# Patient Record
Sex: Female | Born: 1975
Health system: Southern US, Community
[De-identification: ages and names within clinical notes are randomized; demographics above are authoritative.]

## PROBLEM LIST (undated history)

## (undated) DIAGNOSIS — IMO0002 Reserved for concepts with insufficient information to code with codable children: Secondary | ICD-10-CM

## (undated) DIAGNOSIS — Q600 Renal agenesis, unilateral: Secondary | ICD-10-CM

## (undated) DIAGNOSIS — N739 Female pelvic inflammatory disease, unspecified: Secondary | ICD-10-CM

## (undated) DIAGNOSIS — N870 Mild cervical dysplasia: Secondary | ICD-10-CM

## (undated) DIAGNOSIS — J45909 Unspecified asthma, uncomplicated: Secondary | ICD-10-CM

## (undated) DIAGNOSIS — F329 Major depressive disorder, single episode, unspecified: Secondary | ICD-10-CM

## (undated) DIAGNOSIS — F32A Depression, unspecified: Secondary | ICD-10-CM

## (undated) DIAGNOSIS — N200 Calculus of kidney: Secondary | ICD-10-CM

## (undated) HISTORY — PX: NEPHRECTOMY: SHX65

## (undated) HISTORY — DX: Calculus of kidney: N20.0

## (undated) HISTORY — DX: Depression, unspecified: F32.A

## (undated) HISTORY — DX: Renal agenesis, unilateral: Q60.0

## (undated) HISTORY — DX: Mild cervical dysplasia: N87.0

## (undated) HISTORY — DX: Female pelvic inflammatory disease, unspecified: N73.9

## (undated) HISTORY — DX: Reserved for concepts with insufficient information to code with codable children: IMO0002

## (undated) HISTORY — PX: DILATION AND CURETTAGE OF UTERUS: SHX78

## (undated) HISTORY — DX: Major depressive disorder, single episode, unspecified: F32.9

---

## 1999-05-06 ENCOUNTER — Encounter: Payer: Self-pay | Admitting: *Deleted

## 1999-05-06 ENCOUNTER — Observation Stay (HOSPITAL_COMMUNITY): Admission: AD | Admit: 1999-05-06 | Discharge: 1999-05-06 | Payer: Self-pay | Admitting: *Deleted

## 1999-05-21 ENCOUNTER — Ambulatory Visit (HOSPITAL_COMMUNITY): Admission: RE | Admit: 1999-05-21 | Discharge: 1999-05-21 | Payer: Self-pay | Admitting: *Deleted

## 1999-06-04 ENCOUNTER — Encounter: Payer: Self-pay | Admitting: *Deleted

## 1999-06-04 ENCOUNTER — Ambulatory Visit (HOSPITAL_COMMUNITY): Admission: RE | Admit: 1999-06-04 | Discharge: 1999-06-04 | Payer: Self-pay | Admitting: *Deleted

## 1999-07-03 ENCOUNTER — Encounter: Payer: Self-pay | Admitting: Obstetrics & Gynecology

## 1999-07-03 ENCOUNTER — Ambulatory Visit (HOSPITAL_COMMUNITY): Admission: RE | Admit: 1999-07-03 | Discharge: 1999-07-03 | Payer: Self-pay | Admitting: Obstetrics & Gynecology

## 1999-09-02 ENCOUNTER — Inpatient Hospital Stay (HOSPITAL_COMMUNITY): Admission: RE | Admit: 1999-09-02 | Discharge: 1999-09-02 | Payer: Self-pay | Admitting: Obstetrics and Gynecology

## 1999-09-02 ENCOUNTER — Encounter: Payer: Self-pay | Admitting: Obstetrics and Gynecology

## 1999-10-11 ENCOUNTER — Inpatient Hospital Stay (HOSPITAL_COMMUNITY): Admission: AD | Admit: 1999-10-11 | Discharge: 1999-10-11 | Payer: Self-pay | Admitting: *Deleted

## 1999-10-16 ENCOUNTER — Inpatient Hospital Stay (HOSPITAL_COMMUNITY): Admission: AD | Admit: 1999-10-16 | Discharge: 1999-10-19 | Payer: Self-pay | Admitting: Obstetrics and Gynecology

## 1999-10-16 ENCOUNTER — Inpatient Hospital Stay (HOSPITAL_COMMUNITY): Admission: AD | Admit: 1999-10-16 | Discharge: 1999-10-16 | Payer: Self-pay | Admitting: Obstetrics and Gynecology

## 1999-10-16 ENCOUNTER — Observation Stay (HOSPITAL_COMMUNITY): Admission: AD | Admit: 1999-10-16 | Discharge: 1999-10-16 | Payer: Self-pay | Admitting: Obstetrics and Gynecology

## 2000-12-05 ENCOUNTER — Inpatient Hospital Stay (HOSPITAL_COMMUNITY): Admission: AD | Admit: 2000-12-05 | Discharge: 2000-12-05 | Payer: Self-pay | Admitting: Obstetrics and Gynecology

## 2001-02-03 ENCOUNTER — Encounter: Admission: RE | Admit: 2001-02-03 | Discharge: 2001-02-03 | Payer: Self-pay | Admitting: Family Medicine

## 2001-03-05 ENCOUNTER — Encounter: Admission: RE | Admit: 2001-03-05 | Discharge: 2001-03-05 | Payer: Self-pay | Admitting: Family Medicine

## 2001-04-15 ENCOUNTER — Encounter: Admission: RE | Admit: 2001-04-15 | Discharge: 2001-04-15 | Payer: Self-pay | Admitting: Family Medicine

## 2001-08-03 ENCOUNTER — Emergency Department (HOSPITAL_COMMUNITY): Admission: EM | Admit: 2001-08-03 | Discharge: 2001-08-03 | Payer: Self-pay | Admitting: Emergency Medicine

## 2002-10-21 ENCOUNTER — Emergency Department (HOSPITAL_COMMUNITY): Admission: EM | Admit: 2002-10-21 | Discharge: 2002-10-21 | Payer: Self-pay | Admitting: Emergency Medicine

## 2002-12-26 ENCOUNTER — Encounter: Admission: RE | Admit: 2002-12-26 | Discharge: 2002-12-26 | Payer: Self-pay | Admitting: Family Medicine

## 2003-02-09 ENCOUNTER — Other Ambulatory Visit: Admission: RE | Admit: 2003-02-09 | Discharge: 2003-02-09 | Payer: Self-pay | Admitting: Family Medicine

## 2003-02-09 ENCOUNTER — Encounter: Admission: RE | Admit: 2003-02-09 | Discharge: 2003-02-09 | Payer: Self-pay | Admitting: Family Medicine

## 2003-02-09 ENCOUNTER — Encounter (INDEPENDENT_AMBULATORY_CARE_PROVIDER_SITE_OTHER): Payer: Self-pay | Admitting: *Deleted

## 2003-06-14 ENCOUNTER — Encounter: Admission: RE | Admit: 2003-06-14 | Discharge: 2003-06-14 | Payer: Self-pay | Admitting: Family Medicine

## 2003-06-29 ENCOUNTER — Encounter: Admission: RE | Admit: 2003-06-29 | Discharge: 2003-06-29 | Payer: Self-pay | Admitting: Family Medicine

## 2003-12-02 ENCOUNTER — Encounter (INDEPENDENT_AMBULATORY_CARE_PROVIDER_SITE_OTHER): Payer: Self-pay | Admitting: *Deleted

## 2003-12-02 LAB — CONVERTED CEMR LAB

## 2003-12-07 ENCOUNTER — Ambulatory Visit: Payer: Self-pay | Admitting: Family Medicine

## 2003-12-07 ENCOUNTER — Encounter (INDEPENDENT_AMBULATORY_CARE_PROVIDER_SITE_OTHER): Payer: Self-pay | Admitting: *Deleted

## 2003-12-07 ENCOUNTER — Other Ambulatory Visit: Admission: RE | Admit: 2003-12-07 | Discharge: 2003-12-07 | Payer: Self-pay | Admitting: Family Medicine

## 2004-07-03 ENCOUNTER — Ambulatory Visit: Payer: Self-pay | Admitting: Family Medicine

## 2004-07-05 ENCOUNTER — Ambulatory Visit (HOSPITAL_COMMUNITY): Admission: RE | Admit: 2004-07-05 | Discharge: 2004-07-05 | Payer: Self-pay | Admitting: Internal Medicine

## 2005-01-27 HISTORY — PX: CERVIX LESION DESTRUCTION: SHX591

## 2005-03-30 ENCOUNTER — Emergency Department (HOSPITAL_COMMUNITY): Admission: EM | Admit: 2005-03-30 | Discharge: 2005-03-30 | Payer: Self-pay | Admitting: Emergency Medicine

## 2005-06-23 ENCOUNTER — Emergency Department (HOSPITAL_COMMUNITY): Admission: EM | Admit: 2005-06-23 | Discharge: 2005-06-23 | Payer: Self-pay | Admitting: *Deleted

## 2005-07-23 ENCOUNTER — Other Ambulatory Visit: Admission: RE | Admit: 2005-07-23 | Discharge: 2005-07-23 | Payer: Self-pay | Admitting: Family Medicine

## 2005-07-23 ENCOUNTER — Encounter (INDEPENDENT_AMBULATORY_CARE_PROVIDER_SITE_OTHER): Payer: Self-pay | Admitting: *Deleted

## 2005-07-23 ENCOUNTER — Ambulatory Visit: Payer: Self-pay | Admitting: Family Medicine

## 2005-09-25 ENCOUNTER — Ambulatory Visit: Payer: Self-pay | Admitting: Family Medicine

## 2005-11-18 ENCOUNTER — Encounter (INDEPENDENT_AMBULATORY_CARE_PROVIDER_SITE_OTHER): Payer: Self-pay | Admitting: Specialist

## 2005-11-18 ENCOUNTER — Other Ambulatory Visit: Admission: RE | Admit: 2005-11-18 | Discharge: 2005-11-18 | Payer: Self-pay | Admitting: Family Medicine

## 2005-11-18 ENCOUNTER — Ambulatory Visit: Payer: Self-pay | Admitting: Family Medicine

## 2005-12-16 ENCOUNTER — Ambulatory Visit: Payer: Self-pay | Admitting: Family Medicine

## 2006-01-26 ENCOUNTER — Emergency Department (HOSPITAL_COMMUNITY): Admission: EM | Admit: 2006-01-26 | Discharge: 2006-01-26 | Payer: Self-pay | Admitting: Emergency Medicine

## 2006-03-26 DIAGNOSIS — E663 Overweight: Secondary | ICD-10-CM | POA: Insufficient documentation

## 2006-03-26 DIAGNOSIS — R8789 Other abnormal findings in specimens from female genital organs: Secondary | ICD-10-CM | POA: Insufficient documentation

## 2006-03-27 ENCOUNTER — Encounter (INDEPENDENT_AMBULATORY_CARE_PROVIDER_SITE_OTHER): Payer: Self-pay | Admitting: *Deleted

## 2006-04-06 ENCOUNTER — Emergency Department (HOSPITAL_COMMUNITY): Admission: EM | Admit: 2006-04-06 | Discharge: 2006-04-06 | Payer: Self-pay | Admitting: Emergency Medicine

## 2006-05-26 ENCOUNTER — Telehealth: Payer: Self-pay | Admitting: *Deleted

## 2006-05-26 ENCOUNTER — Ambulatory Visit: Payer: Self-pay | Admitting: Family Medicine

## 2006-05-26 ENCOUNTER — Encounter (INDEPENDENT_AMBULATORY_CARE_PROVIDER_SITE_OTHER): Payer: Self-pay | Admitting: *Deleted

## 2006-06-01 ENCOUNTER — Encounter (INDEPENDENT_AMBULATORY_CARE_PROVIDER_SITE_OTHER): Payer: Self-pay | Admitting: *Deleted

## 2006-06-01 ENCOUNTER — Other Ambulatory Visit: Admission: RE | Admit: 2006-06-01 | Discharge: 2006-06-01 | Payer: Self-pay | Admitting: Family Medicine

## 2006-06-01 ENCOUNTER — Ambulatory Visit: Payer: Self-pay | Admitting: Family Medicine

## 2006-06-01 LAB — CONVERTED CEMR LAB: GC Probe Amp, Genital: NEGATIVE

## 2006-06-08 ENCOUNTER — Encounter: Payer: Self-pay | Admitting: Family Medicine

## 2006-07-06 ENCOUNTER — Telehealth: Payer: Self-pay | Admitting: *Deleted

## 2007-12-14 ENCOUNTER — Emergency Department (HOSPITAL_COMMUNITY): Admission: EM | Admit: 2007-12-14 | Discharge: 2007-12-14 | Payer: Self-pay | Admitting: Emergency Medicine

## 2008-01-28 HISTORY — PX: COLON SURGERY: SHX602

## 2008-01-28 HISTORY — PX: COLECTOMY: SHX59

## 2008-04-20 ENCOUNTER — Emergency Department (HOSPITAL_COMMUNITY): Admission: EM | Admit: 2008-04-20 | Discharge: 2008-04-20 | Payer: Self-pay | Admitting: Emergency Medicine

## 2008-04-25 ENCOUNTER — Encounter (INDEPENDENT_AMBULATORY_CARE_PROVIDER_SITE_OTHER): Payer: Self-pay | Admitting: Family Medicine

## 2008-04-25 ENCOUNTER — Ambulatory Visit: Payer: Self-pay | Admitting: Family Medicine

## 2008-04-25 ENCOUNTER — Other Ambulatory Visit: Admission: RE | Admit: 2008-04-25 | Discharge: 2008-04-25 | Payer: Self-pay | Admitting: Family Medicine

## 2008-04-25 LAB — CONVERTED CEMR LAB
CO2: 22 meq/L (ref 19–32)
Calcium: 9.3 mg/dL (ref 8.4–10.5)
Chloride: 105 meq/L (ref 96–112)
Creatinine, Ser: 1.18 mg/dL (ref 0.40–1.20)
GC Probe Amp, Genital: NEGATIVE
Glucose, Bld: 79 mg/dL (ref 70–99)
HDL: 41 mg/dL (ref >39)
Hepatitis B Surface Ag: NEGATIVE
LDL Cholesterol: 96 mg/dL (ref 0–99)
Total CHOL/HDL Ratio: 3.7

## 2008-04-28 ENCOUNTER — Encounter (INDEPENDENT_AMBULATORY_CARE_PROVIDER_SITE_OTHER): Payer: Self-pay | Admitting: Family Medicine

## 2008-09-30 ENCOUNTER — Emergency Department (HOSPITAL_COMMUNITY): Admission: EM | Admit: 2008-09-30 | Discharge: 2008-09-30 | Payer: Self-pay | Admitting: Emergency Medicine

## 2008-11-07 ENCOUNTER — Ambulatory Visit: Payer: Self-pay | Admitting: Family Medicine

## 2008-11-07 DIAGNOSIS — F329 Major depressive disorder, single episode, unspecified: Secondary | ICD-10-CM | POA: Insufficient documentation

## 2008-11-07 LAB — CONVERTED CEMR LAB
Bilirubin Urine: NEGATIVE
Protein, U semiquant: 30
Urobilinogen, UA: 1

## 2008-11-08 ENCOUNTER — Encounter: Payer: Self-pay | Admitting: Family Medicine

## 2008-11-13 ENCOUNTER — Encounter: Payer: Self-pay | Admitting: Family Medicine

## 2008-11-13 ENCOUNTER — Ambulatory Visit: Payer: Self-pay | Admitting: Family Medicine

## 2008-11-13 LAB — CONVERTED CEMR LAB
Bilirubin Urine: NEGATIVE
Blood in Urine, dipstick: NEGATIVE
Glucose, Urine, Semiquant: NEGATIVE
Protein, U semiquant: 100
RBC / HPF: >20
Urobilinogen, UA: 4
pH: 6

## 2008-11-14 ENCOUNTER — Encounter: Payer: Self-pay | Admitting: Family Medicine

## 2008-11-15 ENCOUNTER — Ambulatory Visit: Payer: Self-pay | Admitting: Family Medicine

## 2008-11-15 ENCOUNTER — Ambulatory Visit (HOSPITAL_COMMUNITY): Admission: RE | Admit: 2008-11-15 | Discharge: 2008-11-15 | Payer: Self-pay | Admitting: Family Medicine

## 2008-11-15 ENCOUNTER — Telehealth: Payer: Self-pay | Admitting: Family Medicine

## 2008-11-15 ENCOUNTER — Telehealth: Payer: Self-pay | Admitting: *Deleted

## 2008-11-15 DIAGNOSIS — N12 Tubulo-interstitial nephritis, not specified as acute or chronic: Secondary | ICD-10-CM | POA: Insufficient documentation

## 2008-11-15 DIAGNOSIS — N200 Calculus of kidney: Secondary | ICD-10-CM | POA: Insufficient documentation

## 2008-11-16 ENCOUNTER — Ambulatory Visit: Payer: Self-pay | Admitting: Family Medicine

## 2008-11-16 ENCOUNTER — Inpatient Hospital Stay (HOSPITAL_COMMUNITY): Admission: AD | Admit: 2008-11-16 | Discharge: 2008-12-06 | Payer: Self-pay | Admitting: Family Medicine

## 2008-11-16 ENCOUNTER — Telehealth: Payer: Self-pay | Admitting: Family Medicine

## 2008-11-16 ENCOUNTER — Encounter: Payer: Self-pay | Admitting: Family Medicine

## 2008-11-17 ENCOUNTER — Encounter: Payer: Self-pay | Admitting: Family Medicine

## 2008-11-18 ENCOUNTER — Encounter: Payer: Self-pay | Admitting: Family Medicine

## 2008-11-24 ENCOUNTER — Encounter (INDEPENDENT_AMBULATORY_CARE_PROVIDER_SITE_OTHER): Payer: Self-pay | Admitting: General Surgery

## 2008-11-24 ENCOUNTER — Encounter: Payer: Self-pay | Admitting: Family Medicine

## 2008-11-28 LAB — CONVERTED CEMR LAB
Ferritin: 200 ng/mL
Folate: 4.8 ng/mL
Retic Ct Pct: 1.6 %
Saturation Ratios: 39 %
Vitamin B-12: 596 pg/mL

## 2008-12-07 ENCOUNTER — Encounter: Payer: Self-pay | Admitting: Family Medicine

## 2008-12-08 ENCOUNTER — Emergency Department (HOSPITAL_COMMUNITY): Admission: EM | Admit: 2008-12-08 | Discharge: 2008-12-08 | Payer: Self-pay | Admitting: Emergency Medicine

## 2008-12-15 ENCOUNTER — Ambulatory Visit (HOSPITAL_COMMUNITY): Admission: RE | Admit: 2008-12-15 | Discharge: 2008-12-15 | Payer: Self-pay | Admitting: General Surgery

## 2008-12-18 ENCOUNTER — Ambulatory Visit: Payer: Self-pay | Admitting: Family Medicine

## 2008-12-18 DIAGNOSIS — D649 Anemia, unspecified: Secondary | ICD-10-CM | POA: Insufficient documentation

## 2008-12-22 ENCOUNTER — Telehealth: Payer: Self-pay | Admitting: Family Medicine

## 2009-01-05 ENCOUNTER — Encounter: Payer: Self-pay | Admitting: Family Medicine

## 2009-01-05 ENCOUNTER — Ambulatory Visit: Payer: Self-pay | Admitting: Family Medicine

## 2009-01-08 LAB — CONVERTED CEMR LAB
MCV: 86.9 fL (ref 78.0–100.0)
Platelets: 403 10*3/uL — ABNORMAL HIGH (ref 150–400)
WBC: 8.8 10*3/uL (ref 4.0–10.5)

## 2009-01-29 ENCOUNTER — Encounter (INDEPENDENT_AMBULATORY_CARE_PROVIDER_SITE_OTHER): Payer: Self-pay | Admitting: *Deleted

## 2009-01-29 DIAGNOSIS — F17200 Nicotine dependence, unspecified, uncomplicated: Secondary | ICD-10-CM | POA: Insufficient documentation

## 2009-02-05 ENCOUNTER — Encounter: Payer: Self-pay | Admitting: Family Medicine

## 2009-04-04 ENCOUNTER — Encounter: Payer: Self-pay | Admitting: Family Medicine

## 2009-04-29 ENCOUNTER — Emergency Department (HOSPITAL_COMMUNITY): Admission: EM | Admit: 2009-04-29 | Discharge: 2009-04-29 | Payer: Self-pay | Admitting: Emergency Medicine

## 2009-05-01 ENCOUNTER — Encounter: Admission: RE | Admit: 2009-05-01 | Discharge: 2009-05-01 | Payer: Self-pay | Admitting: General Surgery

## 2009-05-11 ENCOUNTER — Inpatient Hospital Stay (HOSPITAL_COMMUNITY): Admission: RE | Admit: 2009-05-11 | Discharge: 2009-05-16 | Payer: Self-pay | Admitting: General Surgery

## 2009-05-11 ENCOUNTER — Encounter (INDEPENDENT_AMBULATORY_CARE_PROVIDER_SITE_OTHER): Payer: Self-pay | Admitting: General Surgery

## 2009-05-28 ENCOUNTER — Ambulatory Visit: Payer: Self-pay | Admitting: Family Medicine

## 2009-05-28 ENCOUNTER — Inpatient Hospital Stay (HOSPITAL_COMMUNITY): Admission: EM | Admit: 2009-05-28 | Discharge: 2009-06-03 | Payer: Self-pay | Admitting: Emergency Medicine

## 2009-06-08 ENCOUNTER — Ambulatory Visit: Payer: Self-pay | Admitting: Family Medicine

## 2009-06-08 ENCOUNTER — Encounter: Payer: Self-pay | Admitting: Family Medicine

## 2009-06-08 DIAGNOSIS — Q602 Renal agenesis, unspecified: Secondary | ICD-10-CM | POA: Insufficient documentation

## 2009-06-08 DIAGNOSIS — L02219 Cutaneous abscess of trunk, unspecified: Secondary | ICD-10-CM | POA: Insufficient documentation

## 2009-06-08 DIAGNOSIS — L03319 Cellulitis of trunk, unspecified: Secondary | ICD-10-CM

## 2009-06-08 DIAGNOSIS — Q605 Renal hypoplasia, unspecified: Secondary | ICD-10-CM

## 2009-06-08 LAB — CONVERTED CEMR LAB
CO2: 25 meq/L (ref 19–32)
Calcium: 9.3 mg/dL (ref 8.4–10.5)
Potassium: 4.5 meq/L (ref 3.5–5.3)
Sodium: 139 meq/L (ref 135–145)

## 2009-06-11 ENCOUNTER — Encounter: Payer: Self-pay | Admitting: Family Medicine

## 2009-07-19 ENCOUNTER — Encounter: Payer: Self-pay | Admitting: Family Medicine

## 2009-08-06 ENCOUNTER — Encounter: Payer: Self-pay | Admitting: Family Medicine

## 2009-08-06 ENCOUNTER — Ambulatory Visit: Payer: Self-pay | Admitting: Family Medicine

## 2009-08-06 DIAGNOSIS — A630 Anogenital (venereal) warts: Secondary | ICD-10-CM | POA: Insufficient documentation

## 2009-08-06 LAB — CONVERTED CEMR LAB
Chlamydia, DNA Probe: NEGATIVE
GC Probe Amp, Genital: NEGATIVE

## 2009-08-09 ENCOUNTER — Encounter: Payer: Self-pay | Admitting: Family Medicine

## 2009-08-16 ENCOUNTER — Ambulatory Visit: Payer: Self-pay | Admitting: Family Medicine

## 2009-08-16 LAB — CONVERTED CEMR LAB: Beta hcg, urine, semiquantitative: NEGATIVE

## 2009-11-01 ENCOUNTER — Ambulatory Visit: Payer: Self-pay | Admitting: Family Medicine

## 2009-12-12 ENCOUNTER — Encounter: Payer: Self-pay | Admitting: Family Medicine

## 2010-02-28 NOTE — Miscellaneous (Signed)
Summary: [name]  Clinical Lists Changes  Problems: Added new problem of [name] (ICD-305.1) 

## 2010-02-28 NOTE — Miscellaneous (Signed)
Summary: patient summary  Clinical Lists Changes  very nice patient, with unfortunate complicated history    Problems: Changed problem from ABSCESS, INTRAABDOMINAL (ICD-682.2) to History of  ABSCESS, INTRAABDOMINAL (ICD-682.2) Changed problem from COLOSTOMY (ICD-V44.3) to History of  COLOSTOMY (ICD-V44.3) Changed problem from ASTHMA NOS W/ACUTE EXACERBATION (ICD-493.92) to ASTHMA (ICD-493.90) Changed problem from PAPANICOLAOU SMEAR, ABNORMAL (ICD-795.0) to History of  PAPANICOLAOU SMEAR, ABNORMAL (ICD-795.0) Changed problem from OBESITY, NOS (ICD-278.00) to OVERWEIGHT (ICD-278.02) Assessed RENAL CALCULUS, RECURRENT as comment only - obstructing stones lead to terrible pyelo and formation of intrabdominal abscess and colonic-ureteral fistula.  She had a nephrectomy and colostomy.  has since had a colostomy take down and is doing relatively well.  I would have a very low threshold for imaging if she has any abdominal/back pain that could be renal calculus. Assessed History of  ABSCESS, INTRAABDOMINAL as comment only - see above Assessed History of  COLOSTOMY as comment only Assessed ASTHMA as comment only Assessed SOLITARY KIDNEY as comment only Assessed TOBACCO USER as comment only Assessed ANEMIA, MILD as comment only -  Her updated medication list for this problem includes:    Ferrous Sulfate 325 (65 Fe) Mg Tabs (Ferrous sulfate) .Marland Kitchen... 1 tab by mouth tid  for anemia  Assessed DEPRESSION, CHRONIC as comment only Observations: Added new observation of PAST SURG HX: D&C x 2 uretocolonic fistula repair 2010 colosotomy 2010 colostomy take down 05/07/08 Nephrectomy in 10/10 due to infection (07/19/2009 11:49) Added new observation of PMH REVIEWED: reviewed - no changes required (07/19/2009 11:49)      Impression & Recommendations:  Problem # 1:  RENAL CALCULUS, RECURRENT (ICD-592.0) Assessment Comment Only obstructing stones lead to terrible pyelo and formation of intrabdominal  abscess and colonic-ureteral fistula.  She had a nephrectomy and colostomy.  has since had a colostomy take down and is doing relatively well.  I would have a very low threshold for imaging if she has any abdominal/back pain that could be renal calculus.  Problem # 2:  Hx of ABSCESS, INTRAABDOMINAL (ICD-682.2) Assessment: Comment Only see above  Problem # 3:  Hx of COLOSTOMY (ICD-V44.3) Assessment: Comment Only see above  Problem # 4:  ASTHMA (ICD-493.90) Assessment: Comment Only this has not been an issue since she has been my pt, but apparently has a hx and has been on advair at one point  Problem # 5:  SOLITARY KIDNEY (ICD-753.0) Assessment: Comment Only see discussion above.  avoid nephrotoxins   Problem # 6:  TOBACCO USER (ICD-305.1) Assessment: Comment Only  Problem # 7:  ANEMIA, MILD (ICD-285.9) Assessment: Comment Only  on iron.   Her updated medication list for this problem includes:    Ferrous Sulfate 325 (65 Fe) Mg Tabs (Ferrous sulfate) .Marland Kitchen... 1 tab by mouth tid  for anemia  Hgb: 10.5 (01/05/2009)   Hct: 33.3 (01/05/2009)   Platelets: 403 (01/05/2009) RBC: 3.83 (01/05/2009)   RDW: 17.4 (01/05/2009)   WBC: 8.8 (01/05/2009) MCV: 86.9 (01/05/2009)   MCHC: 31.5 (01/05/2009) Retic Ct: 1.6 (11/28/2008)   Ferritin: 200 (11/28/2008) Iron: 50 (11/28/2008)   TIBC: 128 (11/28/2008)   % Sat: 39 (11/28/2008) B12: 596 (11/28/2008)   Folate: 4.8 (11/28/2008)     Her updated medication list for this problem includes:    Ferrous Sulfate 325 (65 Fe) Mg Tabs (Ferrous sulfate) .Marland Kitchen... 1 tab by mouth tid  for anemia  Problem # 8:  DEPRESSION, CHRONIC (ICD-311) Assessment: Comment Only this had actually improved last visit.  she is currently on no  meds.  I would continue to encourage counseling for her  Complete Medication List: 1)  Ferrous Sulfate 325 (65 Fe) Mg Tabs (Ferrous sulfate) .Marland Kitchen.. 1 tab by mouth tid  for anemia 2)  Miralax Powd (Polyethylene glycol 3350) .Marland Kitchen.. 1 capful mixed  in 8ozs of fluid by mouth daily as needed constipation; dispense one large bottle   Past History:  Past Medical History: Reviewed history from 05/28/2009 and no changes required. 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 with colostomy CIN I on colpo 10/07 G4P1 3 SAB (12 and 20 weeks) D&C after both nephrolithiasis depression  Past Surgical History: D&C x 2 uretocolonic fistula repair 2010 colosotomy 2010 colostomy take down 05/07/08 Nephrectomy in 10/10 due to infection

## 2010-02-28 NOTE — Assessment & Plan Note (Signed)
Summary: KH   Vital Signs:  Patient profile:   35 year old female Height:      66.5 inches Weight:      194.9 pounds BMI:     31.10 Temp:     98.4 degrees F oral Pulse rate:   77 / minute BP sitting:   110 / 72  (left arm) Cuff size:   regular  Vitals Entered By: Gladstone Pih (August 16, 2009 9:14 AM) CC: Tx for genital warts Is Patient Diabetic? No Pain Assessment Patient in pain? no        Primary Care Provider:  Asher Muir MD  CC:  Tx for genital warts.  History of Present Illness: Pt is a 35 y.o. who is here for removal of a genital wart.  She states that she has had some warts in the past that were removed by "freezing them" but that she has not had any further lesions since 2007.  This area appeared in April and has been present since.  No pain at area.  No fever.  No chills.  No drainage.    Habits & Providers  Alcohol-Tobacco-Diet     Tobacco Status: current     Tobacco Counseling: to quit use of tobacco products     Cigarette Packs/Day: 0.5  Current Medications (verified): 1)  Ferrous Sulfate 325 (65 Fe) Mg Tabs (Ferrous Sulfate) .Marland Kitchen.. 1 Tab By Mouth Tid  For Anemia 2)  Miralax  Powd (Polyethylene Glycol 3350) .Marland Kitchen.. 1 Capful Mixed in 8ozs of Fluid By Mouth Daily As Needed Constipation; Dispense One Large Bottle  Allergies (verified): 1)  * Mirtazapine  Physical Exam  General:  VSS Well-developed,well-nourished,in no acute distress; alert,appropriate and cooperative throughout examination Genitalia:  One 1cm wart-like lesion, raised, verruca present,  rough texture, on left inner labia near vaginal opening.  No redness. no drainage.     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 Pt informed that will most likely need multiple treatments of regardless of therapy.  Pt desires cryotherapy as she had this on previous lesions and had good  success with this.  Cryotherapy performed on the left lower labial lesion.  Pt tolerated well.  Vasaline gauze applied to area.   Orders: Cryo (1st lesion) benign - [facility] (17000) [facility]- Est Level  3 (16109)  Problem # 2:  CONTRACEPTIVE MANAGEMENT (ICD-V25.09)  Orders: U Preg-[facility] (81025) [facility]- Est Level  3 (99213) u oreg neg  Complete Medication List: 1)  Ferrous Sulfate 325 (65 Fe) Mg Tabs (Ferrous sulfate) .Marland Kitchen.. 1 tab by mouth tid  for anemia 2)  Miralax Powd (Polyethylene glycol 3350) .Marland Kitchen.. 1 capful mixed in 8ozs of fluid by mouth daily as needed constipation; dispense one large bottle  Patient Instructions: 1)  You need to check the area in 1 week. If the area is still present and not resolving call for an appt for a repeat treatment.  You can use vasaline on the area for any discomfort.  Call clinic if any questions or concerns.     Laboratory Results   Urine Tests  Date/Time Received: August 16, 2009 10:02 AM  Date/Time Reported: August 16, 2009 10:09 AM     Urine HCG: negative Comments: ...............test performed by......Marland KitchenBonnie A. [place], MLS (ASCP)cm

## 2010-02-28 NOTE — Assessment & Plan Note (Signed)
Summary: h/up,tcb   Vital Signs:  Patient profile:   35 year old female Height:      66.5 inches Weight:      187.7 pounds BMI:     29.95 Temp:     98.4 degrees F oral Pulse rate:   77 / minute BP sitting:   114 / 82  (left arm) Cuff size:   regular  Vitals Entered By: Garen Grams LPN (Jun 08, 2009 3:41 PM) CC: pelvic abscess, anemia, depression Is Patient Diabetic? No Pain Assessment Patient in pain? no        Primary Care Provider:  Asher Muir MD  CC:  pelvic abscess, anemia, and depression.  History of Present Illness: 1.  recently hospitalized with pelvic abscess after colostomy take down.  drain taken out 2 days ago.  pain well controlled.  has f/u with surgery on 5/18.  no fevers.  no problems with wound healing.    2.  anemia--taken iron three times a day.  no problems with constipation, but on antibiotics for 5 more days.  hgb on d/c was 8.9.    3.  depression--took herself off of her antidepressant.  Feels that she is doing well without it.  Went back to work and that has improved her mood.  does not think she needs it,.      Habits & Providers  Alcohol-Tobacco-Diet     Tobacco Status: current     Tobacco Counseling: to quit use of tobacco products  Current Medications (verified): 1)  Ferrous Sulfate 325 (65 Fe) Mg Tabs (Ferrous Sulfate) .Marland Kitchen.. 1 Tab By Mouth Tid  For Anemia  Allergies: 1)  * Mirtazapine  Review of Systems General:  Denies fever, loss of appetite, and weakness. GU:  Denies dysuria. Psych:  Denies suicidal thoughts/plans. Heme:  Denies bleeding.  Physical Exam  General:  Well-developed,well-nourished,in no acute distress; alert,appropriate and cooperative throughout examination Abdomen:  soft, non-tender, normal bowel sounds, no distention, no masses, no guarding, and no rigidity.   Psych:  Oriented X3, memory intact for recent and remote, normally interactive, good eye contact, not anxious appearing, and not depressed appearing.    Additional Exam:  vital signs reviewed    Impression & Recommendations:  Problem # 1:  ABSCESS, INTRAABDOMINAL (ICD-682.2) Assessment New  actually pelvic abscess.  feeling better. has f/u with surgery  Orders: [facility]- Est  Level 4 (04540)  Problem # 2:  SOLITARY KIDNEY (ICD-753.0) Assessment: Unchanged check bmet, as she had several cat scans (albeit with mucomyst protocol) Orders: Basic Met-[facility] ([id number])  Problem # 3:  ANEMIA, MILD (ICD-285.9) Assessment: Deteriorated  deterioration likely due to blood loss.  continue iron.  plan to recheck in one month Her updated medication list for this problem includes:    Ferrous Sulfate 325 (65 Fe) Mg Tabs (Ferrous sulfate) .Marland Kitchen... 1 tab by mouth tid  for anemia  Orders: [facility]- Est  Level 4 (95621)  Problem # 4:  DEPRESSION, CHRONIC (ICD-311) Assessment: Improved  doing well without meds.  advised to make appt if her mood worsens The following medications were removed from the medication list:    Citalopram Hydrobromide 40 Mg Tabs (Citalopram hydrobromide) .Marland Kitchen... 1 tab by mouth daily for depression    Trazodone Hcl 50 Mg Tabs (Trazodone hcl) .Marland Kitchen... 1 tab by mouth at bedtime for insomnia  Orders: [facility]- Est  Level 4 (99214)  Complete Medication List: 1)  Ferrous Sulfate 325 (65 Fe) Mg Tabs (Ferrous sulfate) .Marland Kitchen.. 1 tab by mouth tid  for anemia 2)  Miralax Powd (Polyethylene glycol 3350) .Marland Kitchen.. 1 capful mixed in 8ozs of fluid by mouth daily as needed constipation; dispense one large bottle  Patient Instructions: 1)  It was nice to see you today.  2)  Take the miralax if you get constipated. 3)  Make a lab appointment in about 4 weeks.   4)  I'll call you with those results.  5)  Schedule a physical and pap at your convenience.  Prescriptions: MIRALAX  POWD (POLYETHYLENE GLYCOL 3350) 1 capful mixed in 8ozs of fluid by mouth daily as needed constipation; dispense one large bottle  #1 x 3   Entered and Authorized by:   Asher Muir  MD   Signed by:   Asher Muir MD on 06/08/2009   Method used:   Electronically to        [facility] [phone]* (retail)       [address]       [place], [place]  [address]       Ph: [phone]       Fax: [phone]   RxID:   [id number]   Prevention & Chronic Care Immunizations   Influenza vaccine: Not documented    Tetanus booster: 12/01/2002: Done.   Tetanus booster due: 11/30/2012    Pneumococcal vaccine: Not documented  Other Screening   Pap smear: NEGATIVE FOR INTRAEPITHELIAL LESIONS OR MALIGNANCY.  (04/25/2008)   Pap smear due: 04/25/2009   Smoking status: current  (06/08/2009)

## 2010-02-28 NOTE — Letter (Signed)
Summary: Generic Letter  [facility]  [address]   [place], [place] [address]   Phone: [phone]  Fax: [phone]    06/11/2009  [facility] [address] [place], [place]  [address]  Dear Ms. Helen Stone,  I just wanted to let you know that your lab results were normal.  Please call me if you have any questions or concerns.            Sincerely,   Asher Muir MD  Appended Document: Generic Letter mailed.

## 2010-02-28 NOTE — Assessment & Plan Note (Signed)
Summary: cpe,tcb   Vital Signs:  Patient profile:   35 year old female Height:      66.5 inches Weight:      192.4 pounds BMI:     30.70 Temp:     98.7 degrees F oral Pulse rate:   77 / minute BP sitting:   108 / 75  (left arm) Cuff size:   large  Vitals Entered By: Gladstone Pih (August 06, 2009 2:36 PM) CC: CPE, genital warts Is Patient Diabetic? No Pain Assessment Patient in pain? no        Primary Provider:  Asher Muir MD  CC:  CPE and genital warts.  History of Present Illness: Pt with hx of genital warts and s/p LEEP in 2007 for abnormal Pap, presenting with an active wart and for STI testing. She had a normal period 12 days ago, then had some blood-tinged discharge over the course of the past 7 days, thinks related to the wart. She has had only one partner in the recent past, however thinks that they are not monogamous. When asked about certain concerns, she states that she wants to be tested for "all of them." Denies any abdominal or vaginal pain or dysparunia.   Preventive Screening-Counseling & Management  Alcohol-Tobacco     Smoking Status: current     Packs/Day: 0.5     Tobacco Counseling: to quit use of tobacco products  Problems Prior to Update: 1)  Screening For Malignant Neoplasm of The Cervix  (ICD-V76.2) 2)  Renal Calculus, Recurrent  (ICD-592.0) 3)  Hx of Abscess, Intraabdominal  (ICD-682.2) 4)  Solitary Kidney  (ICD-753.0) 5)  Tobacco User  (ICD-305.1) 6)  Hx of Colostomy  (ICD-V44.3) 7)  Anemia, Mild  (ICD-285.9) 8)  Hx of Pyelonephritis  (ICD-590.80) 9)  Depression, Chronic  (ICD-311) 10)  Health Maintenance Exam  (ICD-V70.0) 11)  Gynecological Examinationoutine  (ICD-V72.31) 12)  Screening For Malignant Neoplasm (ICD-V76.2) 13)  Exposure To Communicable Disease Nos  (ICD-V01.9) 14)  Asthma  (ICD-493.90) 15)  Hx of Papanicolaou Smear, Abnormal  (ICD-795.0) 16)  Overweight  (ICD-278.02)  Allergies: 1)  * Mirtazapine  Past  History:  Past Medical History: Last updated: 05/28/2009 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 with colostomy CIN I on colpo 10/07 G4P1 3 SAB (12 and 20 weeks) D&C after both nephrolithiasis depression  Past Surgical History: Last updated: 07/19/2009 D&C x 2 uretocolonic fistula repair 2010 colosotomy 2010 colostomy take down 05/07/08 Nephrectomy in 10/10 due to infection  Family History: Last updated: 04/25/2008 Aunts - DM 2, HTN Mom-HTN CHF-uncle, GM, aunt No Cancers dad-HIV  Social History: Last updated: 08/06/2009 single, sexually active with one partner, dtr , aiyahna; rare ETOH; 1/2 pack day;  umemployed but looking for work.   Risk Factors: Alcohol Use: <1 (04/25/2008) Exercise: no (04/25/2008)  Risk Factors: Smoking Status: current (08/06/2009) Packs/Day: 0.5 (08/06/2009)  Family History: Reviewed history from 04/25/2008 and no changes required. Aunts - DM 2, HTN Mom-HTN CHF-uncle, GM, aunt No Cancers dad-HIV  Social History: Reviewed history from 04/25/2008 and no changes required. single, sexually active with one partner, dtr , aiyahna; rare ETOH; 1/2 pack day;  umemployed but looking for work.   Review of Systems       The patient complains of genital sores and abnormal bleeding.  The patient denies fever, weight loss, abdominal pain, and incontinence.    Physical Exam  General:  Well-developed,well-nourished,in no acute distress; alert,appropriate and cooperative throughout examination Head:  Normocephalic and atraumatic without  obvious abnormalities.  Lungs:  Normal respiratory effort, chest expands symmetrically. Lungs are clear to auscultation, no crackles or wheezes. Heart:  Normal rate and regular rhythm. S1 and S2 normal without gallop, murmur, click, rub or other extra sounds. Abdomen:  Bowel sounds positive,abdomen soft and non-tender without masses, organomegaly or hernias noted. Genitalia:  Pelvic Exam:        External:  normal female genitalia with a 1cm wart on left posterior vulva at the 5 oclock position. No bleeding.        Vagina: normal without lesions or masses        Cervix: normal without lesions or masses. small amount of pinkish discharge.        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 She agreed to schedule appt with women's clinic next thursday for treatment/removal of wart.   Orders: [facility]- Est Level  3 (99213)  Problem # 2:  SCREENING FOR MALIGNANT NEOPLASM OF THE CERVIX (ICD-V76.2) Assessment: Unchanged Will undergo annual screening since she has new sexual partner. Past three have been normal since her LEEP in 2007. Will type for HPV if abnormal cells are detected. Will f/u results by phone if abnormal. Orders: Pap Smear- [facility] (Pap) [facility]- Est Level  3 (08657)  Problem # 3:  EXPOSURE TO COMMUNICABLE DISEASE NOS (ICD-V01.9) Will test for STIs per patients request and exposure and f/u abnormal tests by phone.  Orders: Chlamydia and GC Probe Amp, genital ([id number]) HIV-[facility] ([id number]) RPR-[facility] [phone]) Chlamydia and GC Probe Amp, genital ([id number])  Complete Medication List: 1)  Ferrous Sulfate 325 (65 Fe) Mg Tabs (Ferrous sulfate) .Marland Kitchen.. 1 tab by mouth tid  for anemia 2)  Miralax Powd (Polyethylene glycol 3350) .Marland Kitchen.. 1 capful mixed in 8ozs of fluid by mouth daily as needed constipation; dispense one large bottle  Patient Instructions: 1)  Thanks for coming to clinic today. 2)  I will call if your tests are positive.  3)  Please make a follow up appt in [facility] clinic or July 21 or whenever it is convenient.

## 2010-02-28 NOTE — Miscellaneous (Signed)
  Clinical Lists Changes  Problems: Removed problem of ASTHMA (ICD-493.90) 

## 2010-02-28 NOTE — Assessment & Plan Note (Signed)
Summary: FLU SHOT/KH  Nurse Visit  FLU SHOT GIVEN TODAY.Jimmy Footman, CMA  November 01, 2009 10:57 AM   Vital Signs:  Patient profile:   35 year old female Temp:     98.8 degrees F oral CC: flu shot   Allergies: 1)  * Mirtazapine  Immunizations Administered:  Influenza Vaccine # 1:    Vaccine Type: Fluvax 3+    Site: left deltoid    Mfr: Sanofi Pasteur    Dose: 0.5 ml    Route: IM    Given by: Jimmy Footman, CMA    Exp. Date: 07/24/2010    Lot #: [id number]    VIS given: 08/21/09 version given November 01, 2009.  Flu Vaccine Consent Questions:    Do you have a history of severe allergic reactions to this vaccine? no    Any prior history of allergic reactions to egg and/or gelatin? no    Do you have a sensitivity to the preservative Thimersol? no    Do you have a past history of Guillan-Barre Syndrome? no    Do you currently have an acute febrile illness? no    Have you ever had a severe reaction to latex? no    Vaccine information given and explained to patient? yes    Are you currently pregnant? no  Orders Added: 1)  Flu Vaccine 92yrs + [90658] 2)  Admin 1st Vaccine [95621]

## 2010-02-28 NOTE — Letter (Signed)
Summary: Results Follow-up Letter  [facility]  [address]   [place], [place] [address]   Phone: [phone]  Fax: [phone]    08/09/2009  [address] [place], [place]  [address]  Dear Ms. Sharee Pimple,     Your recent lab tests were normal. Please follow up in women's clinic as we discussed previously. Thank you.    Sincerely,  Lloyd Huger MD [facility]           Appended Document: Results Follow-up Letter patient letter mailed

## 2010-02-28 NOTE — Miscellaneous (Signed)
Summary: Medical Report form  pt dropped off form to be completed, placed on triage desk for any clinical info to be completed. Helen Stone  February 05, 2009 11:37 AM    work form to pcp to complete.Golden Circle RN  February 05, 2009 2:16 PM  form completed and placed in to-be-called pile.  pt already notified.

## 2010-02-28 NOTE — Miscellaneous (Signed)
Summary: CT abd & pelvis approved  Clinical Lists Changes medsolutions approved the CTs. #[id number].Marland KitchenGolden Circle RN  April 04, 2009 2:21 PM  Kennon Rounds, do I need to do any thing in regards to this?  Thanks  no, I forgot to sign it off & it went to you. I like to have good documentation about anything that we might have to re-do.Just sign it off.Golden Circle RN  April 05, 2009 12:16 PM

## 2010-03-15 ENCOUNTER — Encounter: Payer: Self-pay | Admitting: *Deleted

## 2010-03-22 ENCOUNTER — Ambulatory Visit: Payer: Self-pay | Admitting: Family Medicine

## 2010-03-22 ENCOUNTER — Ambulatory Visit
Admission: RE | Admit: 2010-03-22 | Discharge: 2010-03-22 | Disposition: A | Discharge status: Home / Self Care | Payer: PRIVATE HEALTH INSURANCE | Source: Ambulatory Visit | Attending: Family Medicine | Admitting: Family Medicine

## 2010-03-22 ENCOUNTER — Ambulatory Visit (INDEPENDENT_AMBULATORY_CARE_PROVIDER_SITE_OTHER): Payer: PRIVATE HEALTH INSURANCE | Admitting: Sports Medicine

## 2010-03-22 ENCOUNTER — Telehealth: Payer: Self-pay | Admitting: *Deleted

## 2010-03-22 ENCOUNTER — Telehealth: Payer: Self-pay | Admitting: Sports Medicine

## 2010-03-22 VITALS — BP 110/88 | HR 80 | Temp 98.7°F | Ht 65.75 in | Wt 232.9 lb

## 2010-03-22 DIAGNOSIS — L03319 Cellulitis of trunk, unspecified: Secondary | ICD-10-CM

## 2010-03-22 DIAGNOSIS — E663 Overweight: Secondary | ICD-10-CM

## 2010-03-22 DIAGNOSIS — R109 Unspecified abdominal pain: Secondary | ICD-10-CM

## 2010-03-22 DIAGNOSIS — L02219 Cutaneous abscess of trunk, unspecified: Secondary | ICD-10-CM

## 2010-03-22 LAB — CBC WITH DIFFERENTIAL/PLATELET
Eosinophils Absolute: 0.2 10*3/uL (ref 0.0–0.7)
Eosinophils Relative: 2 % (ref 0–5)
Hemoglobin: 12.8 g/dL (ref 12.0–15.0)
Lymphs Abs: 3.4 10*3/uL (ref 0.7–4.0)
MCH: 28.1 pg (ref 26.0–34.0)
MCV: 87.5 fL (ref 78.0–100.0)
Monocytes Absolute: 0.7 10*3/uL (ref 0.1–1.0)
Monocytes Relative: 7 % (ref 3–12)
RBC: 4.55 MIL/uL (ref 3.87–5.11)

## 2010-03-22 LAB — CONVERTED CEMR LAB
AST: 9 units/L (ref 0–37)
BUN: 10 mg/dL (ref 6–23)
Basophils Relative: 0 % (ref 0–1)
CO2: 22 meq/L (ref 19–32)
Calcium: 8.9 mg/dL (ref 8.4–10.5)
Chloride: 107 meq/L (ref 96–112)
Creatinine, Ser: 0.87 mg/dL (ref 0.40–1.20)
Glucose, Bld: 77 mg/dL (ref 70–99)
Hemoglobin: 12.8 g/dL (ref 12.0–15.0)
Lipase: 13 units/L (ref 0–75)
Lymphs Abs: 3.4 10*3/uL (ref 0.7–4.0)
MCHC: 32.2 g/dL (ref 30.0–36.0)
Monocytes Absolute: 0.7 10*3/uL (ref 0.1–1.0)
Monocytes Relative: 7 % (ref 3–12)
Neutro Abs: 5.6 10*3/uL (ref 1.7–7.7)
RBC: 4.55 M/uL (ref 3.87–5.11)
TSH: 0.958 microintl units/mL (ref 0.350–4.500)

## 2010-03-22 LAB — POCT UA - MICROSCOPIC ONLY

## 2010-03-22 LAB — POCT URINALYSIS DIPSTICK
Bilirubin, UA: NEGATIVE
Blood, UA: NEGATIVE
Glucose, UA: NEGATIVE
Spec Grav, UA: >=1.03

## 2010-03-22 LAB — COMPREHENSIVE METABOLIC PANEL
Alkaline Phosphatase: 60 U/L (ref 39–117)
BUN: 10 mg/dL (ref 6–23)
Glucose, Bld: 77 mg/dL (ref 70–99)
Sodium: 141 mEq/L (ref 135–145)
Total Bilirubin: 0.7 mg/dL (ref 0.3–1.2)

## 2010-03-22 MED ORDER — CEPHALEXIN 500 MG PO CAPS: 500.0000 mg | ORAL_CAPSULE | Freq: Two times a day (BID) | ORAL | Status: AC

## 2010-03-22 MED ORDER — IOHEXOL 300 MG/ML  SOLN
100.0000 mL | Freq: Once | INTRAMUSCULAR | Status: AC | PRN
  Administered 2010-03-22: 100 mL via INTRAVENOUS

## 2010-03-22 NOTE — Progress Notes (Signed)
  Subjective:    Patient ID: Helen Stone, female    DOB: [birth year], 35 y.o.   MRN: [id number]  HPI This pt has a complicated PMHx including coloureteral fistula with colectomy/colostomy, s/p takedown, s/p L nephrectomy, s/p IR drainage of pelvic abscesses, here with abd pain.  Present for several days now, burning in nature, present LLQ no radiation.  No fevers/chills/N/v/D/C/vag DC/dysuria.  No pain with eating.  No rashes.  She feels that her symptoms may be 2/2 stress.   Review of Systems    See HPI Objective:   Physical Exam  Constitutional: She appears well-developed and well-nourished. No distress.  Cardiovascular: Normal rate, regular rhythm and normal heart sounds.  Exam reveals no gallop and no friction rub.   No murmur heard. Pulmonary/Chest: Effort normal and breath sounds normal. No respiratory distress. She has no wheezes. She exhibits no tenderness.  Abdominal: Soft. Bowel sounds are normal. She exhibits no distension and no mass. There is tenderness. There is no rebound and no guarding.       TTP LLQ.  Skin: Skin is warm and dry.          Assessment & Plan:

## 2010-03-22 NOTE — Assessment & Plan Note (Addendum)
Recurrent abd pain s/p colectomy w takedown, nephrectomy, and hx pelvic abscesses last year. Needs CBC, CMET, lipase, CT abd/pelv w PO/IV contrast to assess for re-collection, hernia, or other intra-abd process. UA suggestive of concurrent cystitis, will tx with keflex and send UCx.  Pt called re: prescription.  RTC Monday to re-eval.

## 2010-03-22 NOTE — Telephone Encounter (Signed)
Called [facility], they advised to send patient over and she can have it done now [employer], [employer]

## 2010-03-22 NOTE — Patient Instructions (Signed)
Out of work till Monday. CT abdomen, labs, urine. Come back to see me Monday to Tuesday to see how you are doing.  -Dr. Karie Schwalbe.

## 2010-03-22 NOTE — Telephone Encounter (Signed)
Spoke with pt re CT results. Inconclusive as to whether collection is pelvic abscess vs cyst. UA suggestive of UTI, recommended antibiotics, then re-eval Monday, if still in pain will eval further with pelvic ultrasound. Pt agreeable.

## 2010-03-24 LAB — URINE CULTURE: Colony Count: NO GROWTH

## 2010-04-16 ENCOUNTER — Emergency Department (HOSPITAL_COMMUNITY): Payer: PRIVATE HEALTH INSURANCE

## 2010-04-16 ENCOUNTER — Emergency Department (HOSPITAL_COMMUNITY)
Admission: EM | Admit: 2010-04-16 | Discharge: 2010-04-16 | Disposition: A | Discharge status: Home / Self Care | Payer: PRIVATE HEALTH INSURANCE | Attending: Emergency Medicine | Admitting: Emergency Medicine

## 2010-04-16 DIAGNOSIS — S8990XA Unspecified injury of unspecified lower leg, initial encounter: Secondary | ICD-10-CM | POA: Insufficient documentation

## 2010-04-16 DIAGNOSIS — M79609 Pain in unspecified limb: Secondary | ICD-10-CM | POA: Insufficient documentation

## 2010-04-16 DIAGNOSIS — M7989 Other specified soft tissue disorders: Secondary | ICD-10-CM | POA: Insufficient documentation

## 2010-04-16 DIAGNOSIS — J45909 Unspecified asthma, uncomplicated: Secondary | ICD-10-CM | POA: Insufficient documentation

## 2010-04-16 DIAGNOSIS — W208XXA Other cause of strike by thrown, projected or falling object, initial encounter: Secondary | ICD-10-CM | POA: Insufficient documentation

## 2010-04-16 DIAGNOSIS — R262 Difficulty in walking, not elsewhere classified: Secondary | ICD-10-CM | POA: Insufficient documentation

## 2010-04-16 LAB — URINALYSIS, ROUTINE W REFLEX MICROSCOPIC
Bilirubin Urine: NEGATIVE
Glucose, UA: NEGATIVE mg/dL
Ketones, ur: 15 mg/dL — AB
Nitrite: NEGATIVE
Specific Gravity, Urine: 1.027 (ref 1.005–1.030)
Urobilinogen, UA: 0.2 mg/dL (ref 0.0–1.0)
Urobilinogen, UA: 1 mg/dL (ref 0.0–1.0)
pH: 5.5 (ref 5.0–8.0)
pH: 6.5 (ref 5.0–8.0)

## 2010-04-16 LAB — CBC
HCT: 23.9 % — ABNORMAL LOW (ref 36.0–46.0)
HCT: 25.2 % — ABNORMAL LOW (ref 36.0–46.0)
HCT: 26 % — ABNORMAL LOW (ref 36.0–46.0)
HCT: 26.2 % — ABNORMAL LOW (ref 36.0–46.0)
HCT: 26.9 % — ABNORMAL LOW (ref 36.0–46.0)
HCT: 27 % — ABNORMAL LOW (ref 36.0–46.0)
HCT: 29.2 % — ABNORMAL LOW (ref 36.0–46.0)
HCT: 33.6 % — ABNORMAL LOW (ref 36.0–46.0)
Hemoglobin: 8.1 g/dL — ABNORMAL LOW (ref 12.0–15.0)
Hemoglobin: 8.7 g/dL — ABNORMAL LOW (ref 12.0–15.0)
Hemoglobin: 8.7 g/dL — ABNORMAL LOW (ref 12.0–15.0)
Hemoglobin: 8.9 g/dL — ABNORMAL LOW (ref 12.0–15.0)
MCHC: 33.2 g/dL (ref 30.0–36.0)
MCHC: 33.3 g/dL (ref 30.0–36.0)
MCHC: 33.6 g/dL (ref 30.0–36.0)
MCV: 82.9 fL (ref 78.0–100.0)
MCV: 83.1 fL (ref 78.0–100.0)
MCV: 83.3 fL (ref 78.0–100.0)
MCV: 83.3 fL (ref 78.0–100.0)
MCV: 83.9 fL (ref 78.0–100.0)
Platelets: 312 10*3/uL (ref 150–400)
Platelets: 357 10*3/uL (ref 150–400)
Platelets: 406 10*3/uL — ABNORMAL HIGH (ref 150–400)
Platelets: 408 10*3/uL — ABNORMAL HIGH (ref 150–400)
Platelets: 428 10*3/uL — ABNORMAL HIGH (ref 150–400)
Platelets: 172 10*3/uL (ref 150–400)
Platelets: 177 10*3/uL (ref 150–400)
Platelets: 203 10*3/uL (ref 150–400)
RBC: 2.9 MIL/uL — ABNORMAL LOW (ref 3.87–5.11)
RBC: 3.03 MIL/uL — ABNORMAL LOW (ref 3.87–5.11)
RBC: 3.14 MIL/uL — ABNORMAL LOW (ref 3.87–5.11)
RBC: 3.81 MIL/uL — ABNORMAL LOW (ref 3.87–5.11)
RBC: 3.22 MIL/uL — ABNORMAL LOW (ref 3.87–5.11)
RDW: 16.3 % — ABNORMAL HIGH (ref 11.5–15.5)
RDW: 16.4 % — ABNORMAL HIGH (ref 11.5–15.5)
RDW: 16.5 % — ABNORMAL HIGH (ref 11.5–15.5)
RDW: 17.3 % — ABNORMAL HIGH (ref 11.5–15.5)
WBC: 15 10*3/uL — ABNORMAL HIGH (ref 4.0–10.5)
WBC: 16.4 10*3/uL — ABNORMAL HIGH (ref 4.0–10.5)
WBC: 7.5 10*3/uL (ref 4.0–10.5)
WBC: 7.6 10*3/uL (ref 4.0–10.5)
WBC: 8 10*3/uL (ref 4.0–10.5)
WBC: 8.7 10*3/uL (ref 4.0–10.5)
WBC: 11.6 10*3/uL — ABNORMAL HIGH (ref 4.0–10.5)
WBC: 8.9 10*3/uL (ref 4.0–10.5)

## 2010-04-16 LAB — APTT: aPTT: 43 seconds — ABNORMAL HIGH (ref 24–37)

## 2010-04-16 LAB — CULTURE, BLOOD (ROUTINE X 2): Culture: NO GROWTH

## 2010-04-16 LAB — BASIC METABOLIC PANEL
BUN: 1 mg/dL — ABNORMAL LOW (ref 6–23)
BUN: 2 mg/dL — ABNORMAL LOW (ref 6–23)
BUN: 1 mg/dL — ABNORMAL LOW (ref 6–23)
BUN: 2 mg/dL — ABNORMAL LOW (ref 6–23)
BUN: 6 mg/dL (ref 6–23)
CO2: 23 mEq/L (ref 19–32)
Calcium: 8.5 mg/dL (ref 8.4–10.5)
Calcium: 8.7 mg/dL (ref 8.4–10.5)
Calcium: 8.7 mg/dL (ref 8.4–10.5)
Calcium: 8.6 mg/dL (ref 8.4–10.5)
Chloride: 109 mEq/L (ref 96–112)
Chloride: 110 mEq/L (ref 96–112)
Chloride: 102 mEq/L (ref 96–112)
Creatinine, Ser: 0.75 mg/dL (ref 0.4–1.2)
Creatinine, Ser: 0.81 mg/dL (ref 0.4–1.2)
Creatinine, Ser: 0.9 mg/dL (ref 0.4–1.2)
Creatinine, Ser: 0.82 mg/dL (ref 0.4–1.2)
GFR calc Af Amer: >60 mL/min (ref >60)
GFR calc Af Amer: >60 mL/min (ref >60)
GFR calc Af Amer: >60 mL/min (ref >60)
GFR calc Af Amer: >60 mL/min (ref >60)
GFR calc non Af Amer: >60 mL/min (ref >60)
GFR calc non Af Amer: >60 mL/min (ref >60)
GFR calc non Af Amer: >60 mL/min (ref >60)
GFR calc non Af Amer: >60 mL/min (ref >60)
Glucose, Bld: 93 mg/dL (ref 70–99)
Glucose, Bld: 98 mg/dL (ref 70–99)
Glucose, Bld: 137 mg/dL — ABNORMAL HIGH (ref 70–99)
Potassium: 3.4 mEq/L — ABNORMAL LOW (ref 3.5–5.1)
Potassium: 4.1 mEq/L (ref 3.5–5.1)
Potassium: 3.1 mEq/L — ABNORMAL LOW (ref 3.5–5.1)
Potassium: 3.4 mEq/L — ABNORMAL LOW (ref 3.5–5.1)
Sodium: 135 mEq/L (ref 135–145)
Sodium: 140 mEq/L (ref 135–145)
Sodium: 139 mEq/L (ref 135–145)

## 2010-04-16 LAB — COMPREHENSIVE METABOLIC PANEL
ALT: 11 U/L (ref 0–35)
AST: 11 U/L (ref 0–37)
Albumin: 3.4 g/dL — ABNORMAL LOW (ref 3.5–5.2)
CO2: 25 mEq/L (ref 19–32)
Chloride: 101 mEq/L (ref 96–112)
GFR calc Af Amer: >60 mL/min (ref >60)
GFR calc non Af Amer: >60 mL/min (ref >60)
Sodium: 132 mEq/L — ABNORMAL LOW (ref 135–145)
Total Bilirubin: 0.7 mg/dL (ref 0.3–1.2)

## 2010-04-16 LAB — LACTIC ACID, PLASMA: Lactic Acid, Venous: 0.6 mmol/L (ref 0.5–2.2)

## 2010-04-16 LAB — URINE CULTURE: Colony Count: >=100000

## 2010-04-16 LAB — PROTIME-INR
INR: 1.28 (ref 0.00–1.49)
Prothrombin Time: 15.9 seconds — ABNORMAL HIGH (ref 11.6–15.2)

## 2010-04-16 LAB — URINE MICROSCOPIC-ADD ON

## 2010-04-16 LAB — DIFFERENTIAL
Eosinophils Absolute: 0.2 10*3/uL (ref 0.0–0.7)
Lymphocytes Relative: 11 % — ABNORMAL LOW (ref 12–46)
Lymphs Abs: 1.7 10*3/uL (ref 0.7–4.0)
Neutrophils Relative %: 83 % — ABNORMAL HIGH (ref 43–77)

## 2010-04-16 LAB — CULTURE, ROUTINE-ABSCESS
Culture: NO GROWTH
Gram Stain: NONE SEEN

## 2010-04-16 LAB — LIPASE, BLOOD: Lipase: 14 U/L (ref 11–59)

## 2010-04-16 LAB — POCT PREGNANCY, URINE: Preg Test, Ur: NEGATIVE

## 2010-04-17 LAB — CBC
HCT: 36.5 % (ref 36.0–46.0)
MCV: 83.3 fL (ref 78.0–100.0)
Platelets: 226 10*3/uL (ref 150–400)
RDW: 17.6 % — ABNORMAL HIGH (ref 11.5–15.5)

## 2010-05-01 LAB — CBC
HCT: 27.3 % — ABNORMAL LOW (ref 36.0–46.0)
HCT: 27.4 % — ABNORMAL LOW (ref 36.0–46.0)
HCT: 27.5 % — ABNORMAL LOW (ref 36.0–46.0)
HCT: 27.8 % — ABNORMAL LOW (ref 36.0–46.0)
HCT: 28.1 % — ABNORMAL LOW (ref 36.0–46.0)
HCT: 28.1 % — ABNORMAL LOW (ref 36.0–46.0)
HCT: 28.3 % — ABNORMAL LOW (ref 36.0–46.0)
Hemoglobin: 8.6 g/dL — ABNORMAL LOW (ref 12.0–15.0)
Hemoglobin: 9.2 g/dL — ABNORMAL LOW (ref 12.0–15.0)
Hemoglobin: 9.3 g/dL — ABNORMAL LOW (ref 12.0–15.0)
Hemoglobin: 9.3 g/dL — ABNORMAL LOW (ref 12.0–15.0)
Hemoglobin: 9.4 g/dL — ABNORMAL LOW (ref 12.0–15.0)
Hemoglobin: 9.5 g/dL — ABNORMAL LOW (ref 12.0–15.0)
Hemoglobin: 9.5 g/dL — ABNORMAL LOW (ref 12.0–15.0)
Hemoglobin: 9.6 g/dL — ABNORMAL LOW (ref 12.0–15.0)
Hemoglobin: 9.7 g/dL — ABNORMAL LOW (ref 12.0–15.0)
Hemoglobin: 9.7 g/dL — ABNORMAL LOW (ref 12.0–15.0)
MCHC: 33.6 g/dL (ref 30.0–36.0)
MCHC: 33.6 g/dL (ref 30.0–36.0)
MCHC: 33.7 g/dL (ref 30.0–36.0)
MCHC: 33.8 g/dL (ref 30.0–36.0)
MCHC: 33.9 g/dL (ref 30.0–36.0)
MCHC: 33.9 g/dL (ref 30.0–36.0)
MCHC: 34 g/dL (ref 30.0–36.0)
MCHC: 34.1 g/dL (ref 30.0–36.0)
MCHC: 34.3 g/dL (ref 30.0–36.0)
MCV: 82.7 fL (ref 78.0–100.0)
MCV: 83.3 fL (ref 78.0–100.0)
MCV: 83.6 fL (ref 78.0–100.0)
MCV: 83.9 fL (ref 78.0–100.0)
MCV: 83.9 fL (ref 78.0–100.0)
MCV: 84.2 fL (ref 78.0–100.0)
Platelets: 267 K/uL (ref 150–400)
Platelets: 278 K/uL (ref 150–400)
Platelets: 288 K/uL (ref 150–400)
Platelets: 296 K/uL (ref 150–400)
Platelets: 335 10*3/uL (ref 150–400)
Platelets: 491 10*3/uL — ABNORMAL HIGH (ref 150–400)
RBC: 3.25 MIL/uL — ABNORMAL LOW (ref 3.87–5.11)
RBC: 3.25 MIL/uL — ABNORMAL LOW (ref 3.87–5.11)
RBC: 3.3 MIL/uL — ABNORMAL LOW (ref 3.87–5.11)
RBC: 3.35 MIL/uL — ABNORMAL LOW (ref 3.87–5.11)
RBC: 3.4 MIL/uL — ABNORMAL LOW (ref 3.87–5.11)
RBC: 3.41 MIL/uL — ABNORMAL LOW (ref 3.87–5.11)
RBC: 3.44 MIL/uL — ABNORMAL LOW (ref 3.87–5.11)
RBC: 3.78 MIL/uL — ABNORMAL LOW (ref 3.87–5.11)
RDW: 18.5 % — ABNORMAL HIGH (ref 11.5–15.5)
RDW: 18.8 % — ABNORMAL HIGH (ref 11.5–15.5)
RDW: 18.9 % — ABNORMAL HIGH (ref 11.5–15.5)
RDW: 18.9 % — ABNORMAL HIGH (ref 11.5–15.5)
RDW: 18.9 % — ABNORMAL HIGH (ref 11.5–15.5)
RDW: 19 % — ABNORMAL HIGH (ref 11.5–15.5)
RDW: 19.2 % — ABNORMAL HIGH (ref 11.5–15.5)
RDW: 19.3 % — ABNORMAL HIGH (ref 11.5–15.5)
RDW: 19.3 % — ABNORMAL HIGH (ref 11.5–15.5)
WBC: 11.7 K/uL — ABNORMAL HIGH (ref 4.0–10.5)
WBC: 14.5 K/uL — ABNORMAL HIGH (ref 4.0–10.5)
WBC: 14.6 10*3/uL — ABNORMAL HIGH (ref 4.0–10.5)
WBC: 14.9 10*3/uL — ABNORMAL HIGH (ref 4.0–10.5)
WBC: 15.4 K/uL — ABNORMAL HIGH (ref 4.0–10.5)
WBC: 15.5 10*3/uL — ABNORMAL HIGH (ref 4.0–10.5)
WBC: 17 K/uL — ABNORMAL HIGH (ref 4.0–10.5)

## 2010-05-01 LAB — CULTURE, ROUTINE-ABSCESS

## 2010-05-01 LAB — PROTIME-INR
INR: 1.08 (ref 0.00–1.49)
Prothrombin Time: 13.9 seconds (ref 11.6–15.2)

## 2010-05-01 LAB — URINALYSIS, ROUTINE W REFLEX MICROSCOPIC
Bilirubin Urine: NEGATIVE
Glucose, UA: NEGATIVE mg/dL
Ketones, ur: 15 mg/dL — AB
Nitrite: NEGATIVE
Specific Gravity, Urine: 1.021 (ref 1.005–1.030)
pH: 7 (ref 5.0–8.0)

## 2010-05-01 LAB — BASIC METABOLIC PANEL
BUN: 2 mg/dL — ABNORMAL LOW (ref 6–23)
CO2: 27 mEq/L (ref 19–32)
Calcium: 8.5 mg/dL (ref 8.4–10.5)
Chloride: 99 mEq/L (ref 96–112)
GFR calc Af Amer: >60 mL/min (ref >60)
GFR calc non Af Amer: >60 mL/min (ref >60)
GFR calc non Af Amer: >60 mL/min (ref >60)
Glucose, Bld: 95 mg/dL (ref 70–99)
Potassium: 3.8 mEq/L (ref 3.5–5.1)
Potassium: 3.8 mEq/L (ref 3.5–5.1)
Potassium: 3.9 mEq/L (ref 3.5–5.1)
Sodium: 133 mEq/L — ABNORMAL LOW (ref 135–145)
Sodium: 134 mEq/L — ABNORMAL LOW (ref 135–145)
Sodium: 137 mEq/L (ref 135–145)

## 2010-05-01 LAB — PHOSPHORUS: Phosphorus: 3.7 mg/dL (ref 2.3–4.6)

## 2010-05-01 LAB — GLUCOSE, CAPILLARY: Glucose-Capillary: 98 mg/dL (ref 70–99)

## 2010-05-01 LAB — URINE MICROSCOPIC-ADD ON

## 2010-05-01 LAB — DIFFERENTIAL
Basophils Absolute: 0.3 10*3/uL — ABNORMAL HIGH (ref 0.0–0.1)
Basophils Relative: 0 % (ref 0–1)
Eosinophils Absolute: 0.3 10*3/uL (ref 0.0–0.7)
Lymphocytes Relative: 24 % (ref 12–46)
Lymphs Abs: 1.9 10*3/uL (ref 0.7–4.0)
Lymphs Abs: 3.6 10*3/uL (ref 0.7–4.0)
Monocytes Absolute: 1.1 10*3/uL — ABNORMAL HIGH (ref 0.1–1.0)
Monocytes Relative: 7 % (ref 3–12)
Neutro Abs: 9.3 10*3/uL — ABNORMAL HIGH (ref 1.7–7.7)

## 2010-05-01 LAB — RENAL FUNCTION PANEL
CO2: 25 mEq/L (ref 19–32)
Calcium: 7.5 mg/dL — ABNORMAL LOW (ref 8.4–10.5)
Creatinine, Ser: 0.78 mg/dL (ref 0.4–1.2)
Glucose, Bld: 111 mg/dL — ABNORMAL HIGH (ref 70–99)

## 2010-05-01 LAB — CARDIAC PANEL(CRET KIN+CKTOT+MB+TROPI)
CK, MB: 0.7 ng/mL (ref 0.3–4.0)
Relative Index: INVALID (ref 0.0–2.5)
Total CK: 98 U/L (ref 7–177)

## 2010-05-01 LAB — APTT: aPTT: 32 seconds (ref 24–37)

## 2010-05-02 LAB — COMPREHENSIVE METABOLIC PANEL
ALT: 22 U/L (ref 0–35)
AST: 16 U/L (ref 0–37)
AST: 19 U/L (ref 0–37)
Albumin: 1.3 g/dL — ABNORMAL LOW (ref 3.5–5.2)
Albumin: 1.5 g/dL — ABNORMAL LOW (ref 3.5–5.2)
Alkaline Phosphatase: 31 U/L — ABNORMAL LOW (ref 39–117)
Alkaline Phosphatase: 54 U/L (ref 39–117)
BUN: 5 mg/dL — ABNORMAL LOW (ref 6–23)
BUN: <1 mg/dL — ABNORMAL LOW (ref 6–23)
BUN: <1 mg/dL — ABNORMAL LOW (ref 6–23)
Calcium: 7.4 mg/dL — ABNORMAL LOW (ref 8.4–10.5)
Calcium: 8.1 mg/dL — ABNORMAL LOW (ref 8.4–10.5)
Chloride: 105 mEq/L (ref 96–112)
Chloride: 105 mEq/L (ref 96–112)
Creatinine, Ser: 0.63 mg/dL (ref 0.4–1.2)
Creatinine, Ser: 0.81 mg/dL (ref 0.4–1.2)
Creatinine, Ser: 0.89 mg/dL (ref 0.4–1.2)
GFR calc Af Amer: >60 mL/min (ref >60)
GFR calc Af Amer: >60 mL/min (ref >60)
GFR calc non Af Amer: >60 mL/min (ref >60)
Glucose, Bld: 95 mg/dL (ref 70–99)
Glucose, Bld: 99 mg/dL (ref 70–99)
Potassium: 3.4 mEq/L — ABNORMAL LOW (ref 3.5–5.1)
Potassium: 3.8 mEq/L (ref 3.5–5.1)
Sodium: 132 mEq/L — ABNORMAL LOW (ref 135–145)
Sodium: 133 mEq/L — ABNORMAL LOW (ref 135–145)
Sodium: 134 mEq/L — ABNORMAL LOW (ref 135–145)
Total Bilirubin: 1.2 mg/dL (ref 0.3–1.2)
Total Protein: 4.3 g/dL — ABNORMAL LOW (ref 6.0–8.3)
Total Protein: 6.7 g/dL (ref 6.0–8.3)
Total Protein: 9 g/dL — ABNORMAL HIGH (ref 6.0–8.3)

## 2010-05-02 LAB — CBC
HCT: 24.2 % — ABNORMAL LOW (ref 36.0–46.0)
HCT: 25.4 % — ABNORMAL LOW (ref 36.0–46.0)
HCT: 26.3 % — ABNORMAL LOW (ref 36.0–46.0)
HCT: 28.3 % — ABNORMAL LOW (ref 36.0–46.0)
HCT: 29.3 % — ABNORMAL LOW (ref 36.0–46.0)
HCT: 29.9 % — ABNORMAL LOW (ref 36.0–46.0)
HCT: 30.5 % — ABNORMAL LOW (ref 36.0–46.0)
HCT: 30.9 % — ABNORMAL LOW (ref 36.0–46.0)
HCT: 34.5 % — ABNORMAL LOW (ref 36.0–46.0)
Hemoglobin: 10.3 g/dL — ABNORMAL LOW (ref 12.0–15.0)
Hemoglobin: 8.1 g/dL — ABNORMAL LOW (ref 12.0–15.0)
Hemoglobin: 8.4 g/dL — ABNORMAL LOW (ref 12.0–15.0)
Hemoglobin: 9.5 g/dL — ABNORMAL LOW (ref 12.0–15.0)
Hemoglobin: 9.7 g/dL — ABNORMAL LOW (ref 12.0–15.0)
Hemoglobin: 9.8 g/dL — ABNORMAL LOW (ref 12.0–15.0)
Hemoglobin: 9.9 g/dL — ABNORMAL LOW (ref 12.0–15.0)
Hemoglobin: 9.9 g/dL — ABNORMAL LOW (ref 12.0–15.0)
MCHC: 32.1 g/dL (ref 30.0–36.0)
MCHC: 32.1 g/dL (ref 30.0–36.0)
MCHC: 32.6 g/dL (ref 30.0–36.0)
MCHC: 33.1 g/dL (ref 30.0–36.0)
MCHC: 33.7 g/dL (ref 30.0–36.0)
MCHC: 33.9 g/dL (ref 30.0–36.0)
MCHC: 34.1 g/dL (ref 30.0–36.0)
MCHC: 34.6 g/dL (ref 30.0–36.0)
MCV: 74.3 fL — ABNORMAL LOW (ref 78.0–100.0)
MCV: 75.3 fL — ABNORMAL LOW (ref 78.0–100.0)
MCV: 77.4 fL — ABNORMAL LOW (ref 78.0–100.0)
MCV: 78.2 fL (ref 78.0–100.0)
MCV: 81.4 fL (ref 78.0–100.0)
MCV: 82.4 fL (ref 78.0–100.0)
MCV: 82.6 fL (ref 78.0–100.0)
MCV: 83.3 fL (ref 78.0–100.0)
MCV: 83.8 fL (ref 78.0–100.0)
Platelets: 260 10*3/uL (ref 150–400)
Platelets: 267 10*3/uL (ref 150–400)
Platelets: 306 10*3/uL (ref 150–400)
Platelets: 425 K/uL — ABNORMAL HIGH (ref 150–400)
Platelets: 479 10*3/uL — ABNORMAL HIGH (ref 150–400)
RBC: 3.02 MIL/uL — ABNORMAL LOW (ref 3.87–5.11)
RBC: 3.37 MIL/uL — ABNORMAL LOW (ref 3.87–5.11)
RBC: 3.37 MIL/uL — ABNORMAL LOW (ref 3.87–5.11)
RBC: 3.52 MIL/uL — ABNORMAL LOW (ref 3.87–5.11)
RBC: 3.67 MIL/uL — ABNORMAL LOW (ref 3.87–5.11)
RBC: 3.8 MIL/uL — ABNORMAL LOW (ref 3.87–5.11)
RBC: 3.9 MIL/uL (ref 3.87–5.11)
RDW: 17.3 % — ABNORMAL HIGH (ref 11.5–15.5)
RDW: 17.4 % — ABNORMAL HIGH (ref 11.5–15.5)
RDW: 17.5 % — ABNORMAL HIGH (ref 11.5–15.5)
RDW: 17.5 % — ABNORMAL HIGH (ref 11.5–15.5)
RDW: 17.9 % — ABNORMAL HIGH (ref 11.5–15.5)
RDW: 18.2 % — ABNORMAL HIGH (ref 11.5–15.5)
RDW: 18.4 % — ABNORMAL HIGH (ref 11.5–15.5)
RDW: 18.6 % — ABNORMAL HIGH (ref 11.5–15.5)
RDW: 18.7 % — ABNORMAL HIGH (ref 11.5–15.5)
RDW: 18.8 % — ABNORMAL HIGH (ref 11.5–15.5)
WBC: 13.6 10*3/uL — ABNORMAL HIGH (ref 4.0–10.5)
WBC: 23.9 10*3/uL — ABNORMAL HIGH (ref 4.0–10.5)
WBC: 24.3 10*3/uL — ABNORMAL HIGH (ref 4.0–10.5)
WBC: 26 10*3/uL — ABNORMAL HIGH (ref 4.0–10.5)
WBC: 6.8 10*3/uL (ref 4.0–10.5)
WBC: 9 10*3/uL (ref 4.0–10.5)
WBC: 9.8 10*3/uL (ref 4.0–10.5)

## 2010-05-02 LAB — BASIC METABOLIC PANEL
BUN: <1 mg/dL — ABNORMAL LOW (ref 6–23)
CO2: 17 mEq/L — ABNORMAL LOW (ref 19–32)
CO2: 19 mEq/L (ref 19–32)
CO2: 21 mEq/L (ref 19–32)
CO2: 21 mEq/L (ref 19–32)
Calcium: 8.2 mg/dL — ABNORMAL LOW (ref 8.4–10.5)
Calcium: 8.6 mg/dL (ref 8.4–10.5)
Chloride: 108 mEq/L (ref 96–112)
Chloride: 108 mEq/L (ref 96–112)
Creatinine, Ser: 0.77 mg/dL (ref 0.4–1.2)
Creatinine, Ser: 0.79 mg/dL (ref 0.4–1.2)
Creatinine, Ser: 0.81 mg/dL (ref 0.4–1.2)
GFR calc Af Amer: >60 mL/min (ref >60)
GFR calc Af Amer: >60 mL/min (ref >60)
GFR calc Af Amer: >60 mL/min (ref >60)
GFR calc Af Amer: >60 mL/min (ref >60)
GFR calc Af Amer: >60 mL/min (ref >60)
GFR calc non Af Amer: >60 mL/min (ref >60)
GFR calc non Af Amer: >60 mL/min (ref >60)
GFR calc non Af Amer: >60 mL/min (ref >60)
Glucose, Bld: 54 mg/dL — ABNORMAL LOW (ref 70–99)
Glucose, Bld: 69 mg/dL — ABNORMAL LOW (ref 70–99)
Glucose, Bld: 89 mg/dL (ref 70–99)
Potassium: 3.3 mEq/L — ABNORMAL LOW (ref 3.5–5.1)
Potassium: 3.6 mEq/L (ref 3.5–5.1)
Potassium: 3.9 mEq/L (ref 3.5–5.1)
Sodium: 131 mEq/L — ABNORMAL LOW (ref 135–145)
Sodium: 135 mEq/L (ref 135–145)
Sodium: 136 mEq/L (ref 135–145)
Sodium: 139 mEq/L (ref 135–145)

## 2010-05-02 LAB — CROSSMATCH: Antibody Screen: NEGATIVE

## 2010-05-02 LAB — HEMOGLOBINOPATHY EVALUATION
Hemoglobin Other: 0 % (ref 0.0–0.0)
Hgb A2 Quant: 2.7 % (ref 2.2–3.2)
Hgb F Quant: 0 % (ref 0.0–2.0)

## 2010-05-02 LAB — BASIC METABOLIC PANEL WITH GFR
BUN: <1 mg/dL — ABNORMAL LOW (ref 6–23)
Calcium: 8.2 mg/dL — ABNORMAL LOW (ref 8.4–10.5)
Creatinine, Ser: 0.84 mg/dL (ref 0.4–1.2)
GFR calc non Af Amer: >60 mL/min (ref >60)
Glucose, Bld: 90 mg/dL (ref 70–99)

## 2010-05-02 LAB — IRON AND TIBC: TIBC: 128 ug/dL — ABNORMAL LOW (ref 250–470)

## 2010-05-02 LAB — PREPARE FRESH FROZEN PLASMA

## 2010-05-02 LAB — CULTURE, ROUTINE-ABSCESS

## 2010-05-02 LAB — DIFFERENTIAL
Basophils Absolute: 0.1 10*3/uL (ref 0.0–0.1)
Basophils Relative: 1 % (ref 0–1)
Lymphocytes Relative: 22 % (ref 12–46)
Monocytes Relative: 7 % (ref 3–12)
Neutro Abs: 9.3 10*3/uL — ABNORMAL HIGH (ref 1.7–7.7)
Neutrophils Relative %: 69 % (ref 43–77)

## 2010-05-02 LAB — FOLATE: Folate: 4.8 ng/mL

## 2010-05-02 LAB — RETICULOCYTES
RBC.: 3.16 MIL/uL — ABNORMAL LOW (ref 3.87–5.11)
Retic Ct Pct: 1.6 % (ref 0.4–3.1)

## 2010-05-02 LAB — POCT I-STAT 4, (NA,K, GLUC, HGB,HCT)
Glucose, Bld: 190 mg/dL — ABNORMAL HIGH (ref 70–99)
Glucose, Bld: 212 mg/dL — ABNORMAL HIGH (ref 70–99)
HCT: 29 % — ABNORMAL LOW (ref 36.0–46.0)
HCT: 32 % — ABNORMAL LOW (ref 36.0–46.0)
Hemoglobin: 10.9 g/dL — ABNORMAL LOW (ref 12.0–15.0)
Hemoglobin: 9.9 g/dL — ABNORMAL LOW (ref 12.0–15.0)
Potassium: 3.4 mEq/L — ABNORMAL LOW (ref 3.5–5.1)
Potassium: 4.2 mEq/L (ref 3.5–5.1)
Sodium: 137 mEq/L (ref 135–145)
Sodium: 139 mEq/L (ref 135–145)

## 2010-05-02 LAB — PHOSPHORUS: Phosphorus: 3.4 mg/dL (ref 2.3–4.6)

## 2010-05-02 LAB — GLUCOSE, CAPILLARY: Glucose-Capillary: 138 mg/dL — ABNORMAL HIGH (ref 70–99)

## 2010-05-02 LAB — CARDIAC PANEL(CRET KIN+CKTOT+MB+TROPI): Total CK: 124 U/L (ref 7–177)

## 2010-05-02 LAB — VITAMIN B12: Vitamin B-12: 596 pg/mL (ref 211–911)

## 2010-05-03 LAB — URINALYSIS, ROUTINE W REFLEX MICROSCOPIC
Glucose, UA: NEGATIVE mg/dL
Protein, ur: NEGATIVE mg/dL
pH: 5.5 (ref 5.0–8.0)

## 2010-05-03 LAB — URINE MICROSCOPIC-ADD ON

## 2010-05-03 LAB — URINE CULTURE: Colony Count: 25000

## 2010-05-09 LAB — URINALYSIS, ROUTINE W REFLEX MICROSCOPIC
Glucose, UA: NEGATIVE mg/dL
Protein, ur: 30 mg/dL — AB
Urobilinogen, UA: 1 mg/dL (ref 0.0–1.0)

## 2010-05-09 LAB — CBC
HCT: 31.5 % — ABNORMAL LOW (ref 36.0–46.0)
Hemoglobin: 10.5 g/dL — ABNORMAL LOW (ref 12.0–15.0)
MCHC: 33.2 g/dL (ref 30.0–36.0)
MCV: 78.8 fL (ref 78.0–100.0)
RDW: 15 % (ref 11.5–15.5)

## 2010-05-09 LAB — POCT I-STAT, CHEM 8
Calcium, Ion: 1.14 mmol/L (ref 1.12–1.32)
HCT: 35 % — ABNORMAL LOW (ref 36.0–46.0)
TCO2: 24 mmol/L (ref 0–100)

## 2010-05-09 LAB — URINE MICROSCOPIC-ADD ON

## 2010-05-09 LAB — DIFFERENTIAL
Basophils Absolute: 0.2 10*3/uL — ABNORMAL HIGH (ref 0.0–0.1)
Basophils Relative: 1 % (ref 0–1)
Eosinophils Absolute: 0.2 10*3/uL (ref 0.0–0.7)
Eosinophils Relative: 2 % (ref 0–5)
Monocytes Absolute: 0.7 10*3/uL (ref 0.1–1.0)
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
## 2010-06-14 NOTE — Discharge Summary (Signed)
[facility]  Patient:    Helen Stone, Helen Stone                         MRN: [id number] Adm. Date:  [id number] Disc. Date: [id number] Attending:  Leonard Schwartz Dictator:   Nigel Bridgeman, C.N.M.                           Discharge Summary  ADMITTING DIAGNOSES:          1. Intrauterine pregnancy at 36-5/7ths weeks.                               2. Early labor.  DISCHARGE DIAGNOSES:          1. Intrauterine pregnancy at 36-5/7ths weeks.                               2. Latent phase labor.  PROCEDURE:                    Electronic fetal monitoring.  HOSPITAL COURSE:              Helen Stone is a 35 year old gravida 3, para 0-0-2-0 at 36-5/7ths weeks who was seen in the maternity admissions department on October 16, 1999 in the early morning with signs and symptoms of labor. Her cervix initially on admission was 4, 80% vertex, and -2 station, with bulging bag of water.  A recheck of her cervix at 3:15 by Dr. Pennie Rushing was 3-4 cm, 80% vertex, and -1 to 0 station.  At that time, she was contracting every three to four minutes.  She was admitted to the birthing suite for further labor care.  Patient then subsequently had subsiding of uterine contractions.  She received one dose of Stadol.  By the following morning, when Dr. Stefano Gaul evaluated the patient at approximately 8:15, cervix was 2 cm posterior, uterine contractions were very occasional and mild, fetal heart rate was reactive.  A urine was sent for culture per Dr. Percell Boston order.  Patient was discharged home.  DISCHARGE INSTRUCTIONS:       Per routine prodromal labor patient information.  DISCHARGE MEDICATIONS:        None.  DISCHARGE FOLLOW-UP:          To occur in one week at [facility] or p.r.n. DD:  10/16/99 TD:  10/18/99 Job: [id number]
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
## 2010-09-05 ENCOUNTER — Encounter: Payer: Self-pay | Admitting: Family Medicine

## 2010-09-05 ENCOUNTER — Telehealth: Payer: Self-pay | Admitting: Family Medicine

## 2010-09-05 ENCOUNTER — Ambulatory Visit (INDEPENDENT_AMBULATORY_CARE_PROVIDER_SITE_OTHER): Payer: PRIVATE HEALTH INSURANCE | Admitting: Family Medicine

## 2010-09-05 VITALS — BP 115/78 | HR 108 | Temp 98.5°F | Wt 249.0 lb

## 2010-09-05 DIAGNOSIS — T6391XA Toxic effect of contact with unspecified venomous animal, accidental (unintentional), initial encounter: Secondary | ICD-10-CM

## 2010-09-05 DIAGNOSIS — T63481A Toxic effect of venom of other arthropod, accidental (unintentional), initial encounter: Secondary | ICD-10-CM | POA: Insufficient documentation

## 2010-09-05 MED ORDER — METHYLPREDNISOLONE SODIUM SUCC 40 MG IJ SOLR
40.0000 mg | Freq: Once | INTRAMUSCULAR | Status: AC
  Administered 2010-09-05: 40 mg via INTRAMUSCULAR

## 2010-09-05 MED ORDER — TRIAMCINOLONE ACETONIDE 0.1 % EX CREA: TOPICAL_CREAM | Freq: Two times a day (BID) | CUTANEOUS | Status: DC

## 2010-09-05 NOTE — Assessment & Plan Note (Signed)
Large local reaction to insect sting of unknown cause.  No systemic symtpoms of anaphylaxis or infection. Gave steroid shot in office, instructions for using triamcinolone, ice, antihistamines as needed.  I have marked the site with a pen, advised her to return if continued worsening, to watch for signs of developing infection.

## 2010-09-05 NOTE — Progress Notes (Signed)
  Subjective:    Patient ID: Helen Stone, female    DOB: 04/15/1975, 35 y.o.   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  Thinks she pulls a stinger out. In the past two days it had dramatically swollen up.  Notes no history of response to insect bites.  No dyspnea, wheezing, throat swelling.  No fevers.  I have reviewed patient's  PMH, FH, and Social history and Medications as related to this visit.  Review of Systems     Objective:   Physical Exam GEN: NAD Skin:  Right inner thigh 15 x 22 cm large demarcated area of warmth and erythema.  NO pus, fluctuance.  Difficult to see site of entry.         Assessment & Plan:

## 2010-09-05 NOTE — Telephone Encounter (Signed)
Is scheduled to come in this afternoon.  She was bitten or stung by a bee or something on her leg and it is swollen and itchy.  She wants to know if there is some treatment that she could do so that she wouldn't have to be seen.

## 2010-09-05 NOTE — Patient Instructions (Addendum)
Keep icing the sore spot Use antihistamine (like bendryl which makes you sleepy, or claritin, zyrtec for itching) Use steroid cream for itching. Follow-up if you notice getting worse, fevers,nausea. Make appt to meet your new doctor

## 2010-10-30 LAB — BASIC METABOLIC PANEL
BUN: 4 — ABNORMAL LOW
CO2: 27
Calcium: 9
GFR calc non Af Amer: >60
Glucose, Bld: 111 — ABNORMAL HIGH
Potassium: 3.9

## 2010-10-30 LAB — URINALYSIS, ROUTINE W REFLEX MICROSCOPIC
Ketones, ur: NEGATIVE
Nitrite: NEGATIVE
Protein, ur: 30 — AB
Urobilinogen, UA: 1
pH: 7

## 2010-10-30 LAB — CBC
HCT: 33.4 — ABNORMAL LOW
Hemoglobin: 10.9 — ABNORMAL LOW
MCHC: 32.5
Platelets: 382
RDW: 13.9

## 2010-10-30 LAB — DIFFERENTIAL
Basophils Absolute: 0
Basophils Relative: 0
Eosinophils Relative: 1
Lymphocytes Relative: 19
Monocytes Absolute: 0.8

## 2010-10-30 LAB — WET PREP, GENITAL: Yeast Wet Prep HPF POC: NONE SEEN

## 2010-10-30 LAB — POCT PREGNANCY, URINE: Preg Test, Ur: NEGATIVE

## 2011-05-11 IMAGING — CT CT ABD-PELV W/ CM
2 of 4 series · 13 of 32 positions shown, 18 images · IV contrast (agent unspecified)
Comparison: CT pelvis 05/31/2009.  CT abdomen and pelvis
05/28/2009.

CLINICAL DATA: Abdominal pain post pelvic abscesses.  Assess for
collection or hernia.  Left abdominal pain ongoing for a few weeks.
Left nephrectomy 3999.  Colostomy reversal 05/12/2009.

Renal function studies include BUN 9 and creatinine 0.8.
CT ABDOMEN AND PELVIS WITH CONTRAST
TECHNIQUE: Multidetector CT imaging of the abdomen and pelvis was
performed following the standard protocol during bolus
administration of intravenous contrast.
Contrast: 100 ml [id number]

[Series 2: abdomen w/ · axial · 0.82mm/px · z∈[-342,-67]mm · 5 of 83 slices shown, 10 images]
[im 14/83  soft-tissue]
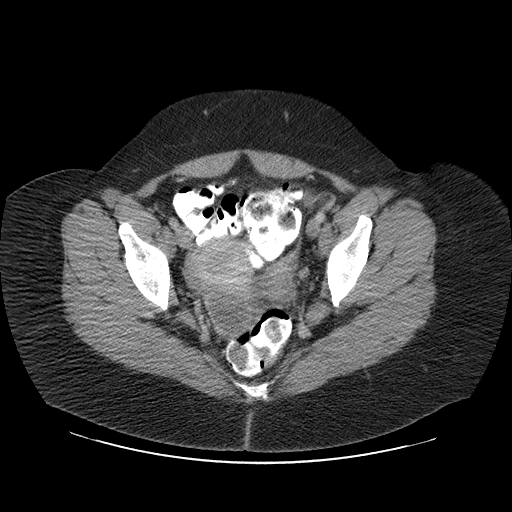
[im 14/83  bone]
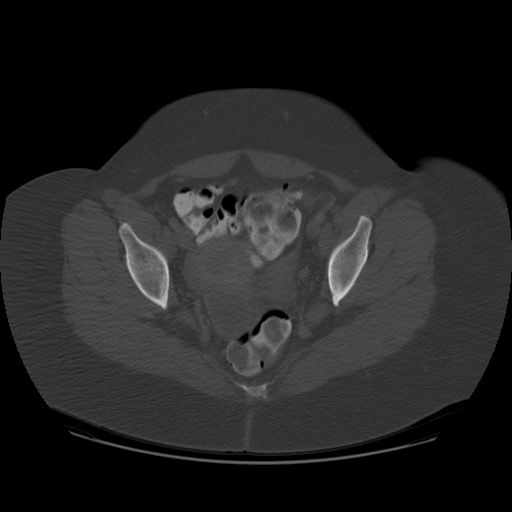
[im 28/83  soft-tissue]
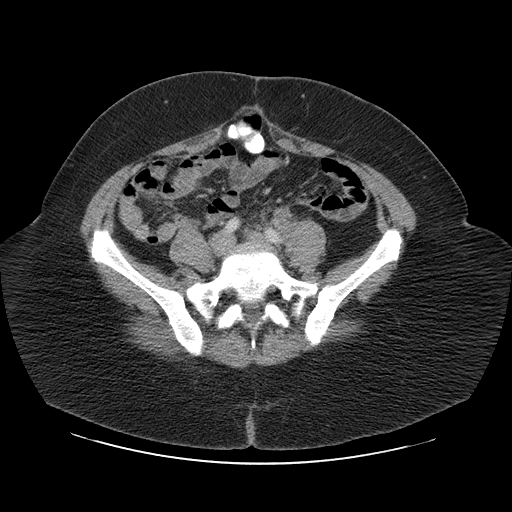
[im 28/83  lung]
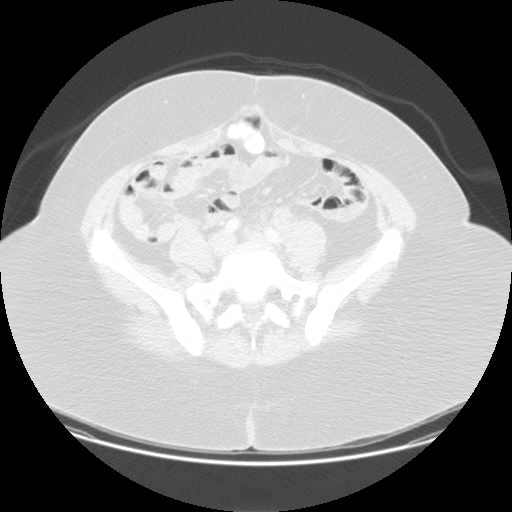
[im 42/83  soft-tissue]
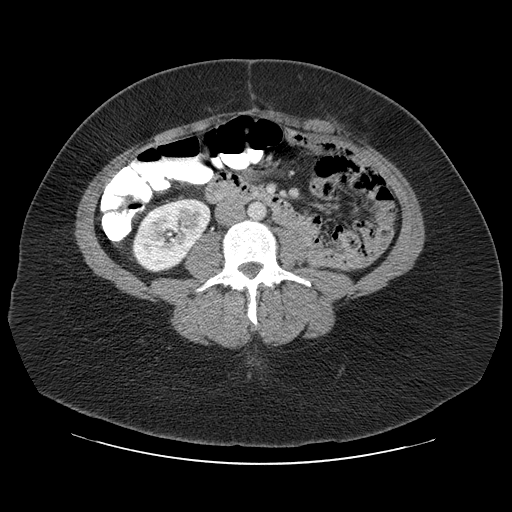
[im 42/83  lung]
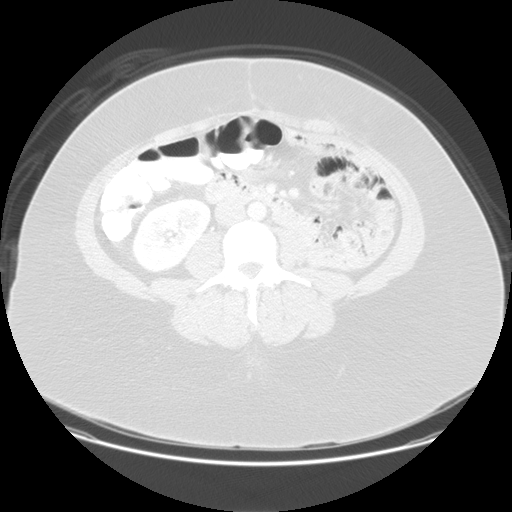
[im 55/83  soft-tissue]
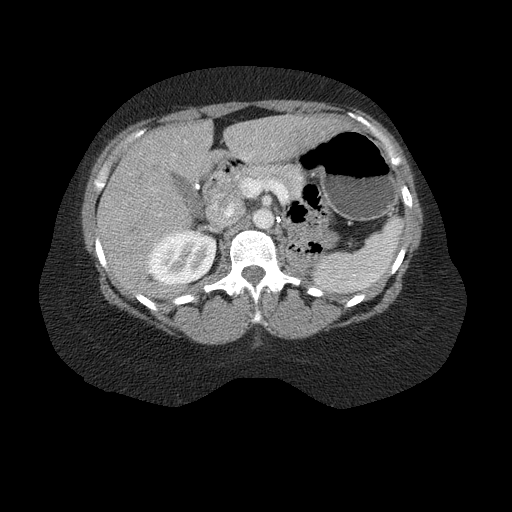
[im 55/83  lung]
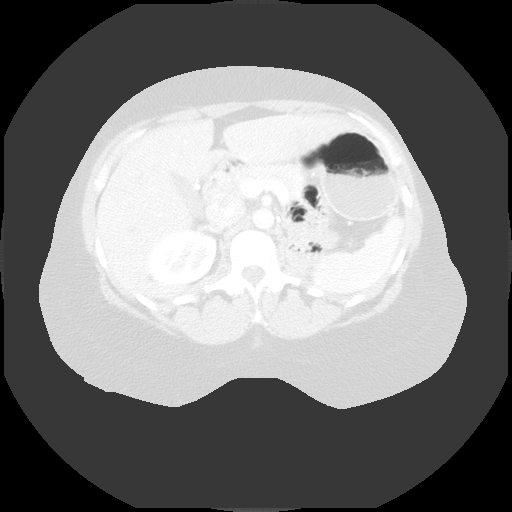
[im 69/83  soft-tissue]
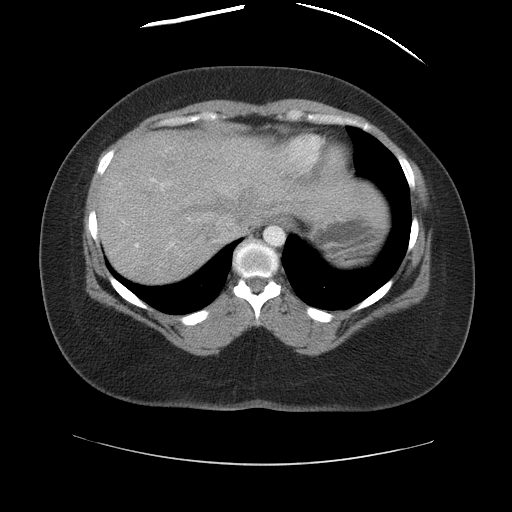
[im 69/83  lung]
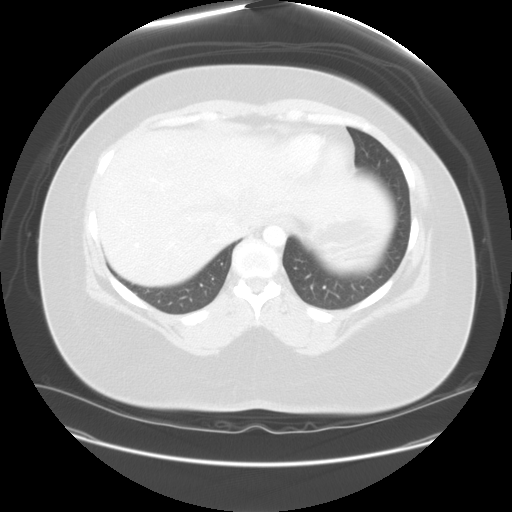

[Series 400: sagittal · sagittal · 0.90mm/px · 8 of 160 slices shown]
[im 14/160  soft-tissue]
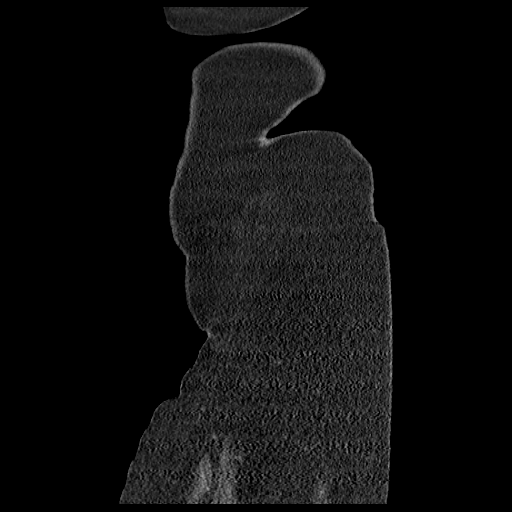
[im 40/160  soft-tissue]
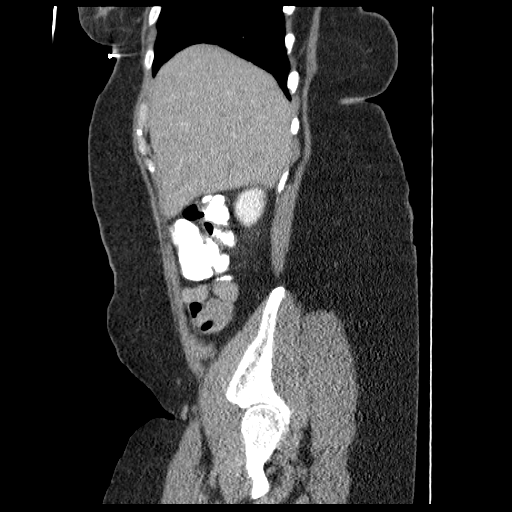
[im 54/160  soft-tissue]
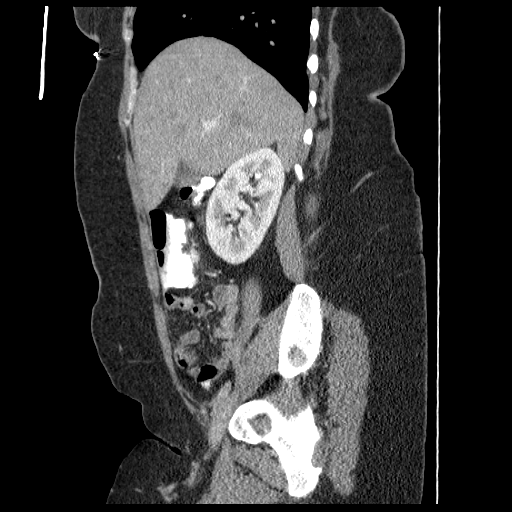
[im 67/160  soft-tissue]
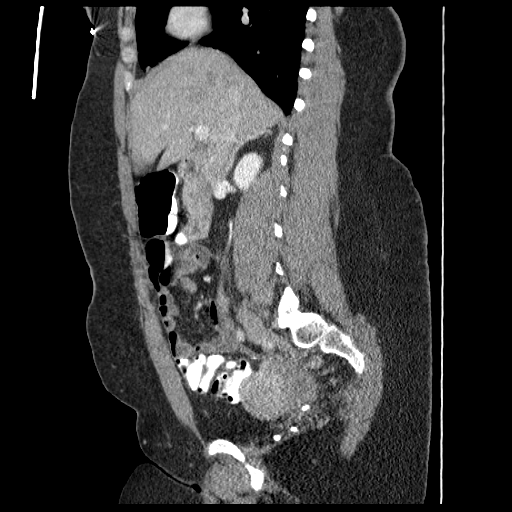
[im 93/160  soft-tissue]
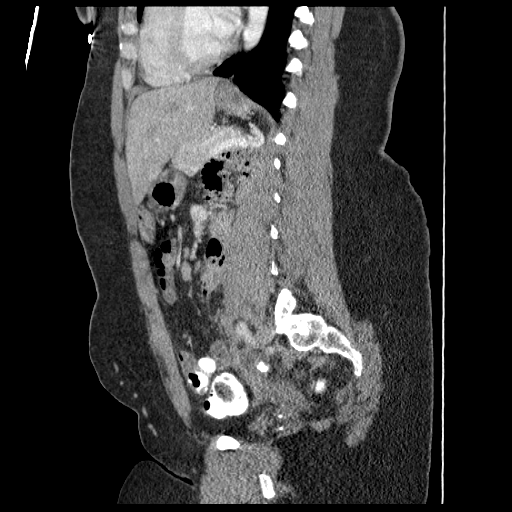
[im 107/160  soft-tissue]
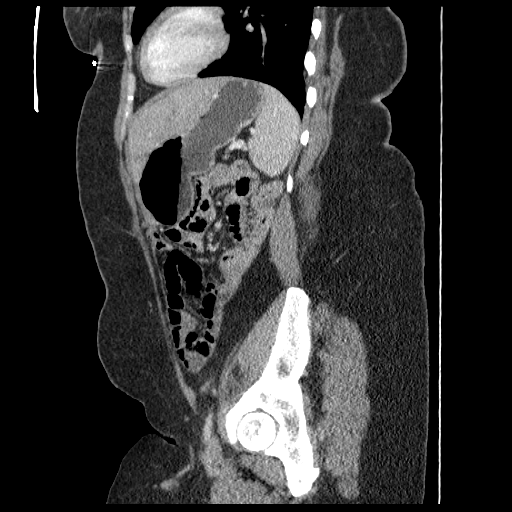
[im 120/160  soft-tissue]
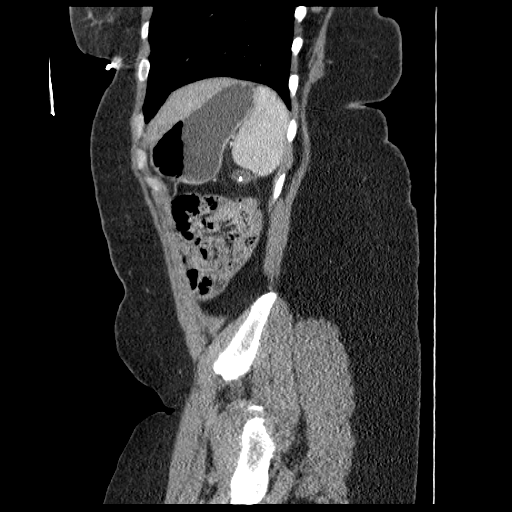
[im 146/160  soft-tissue]
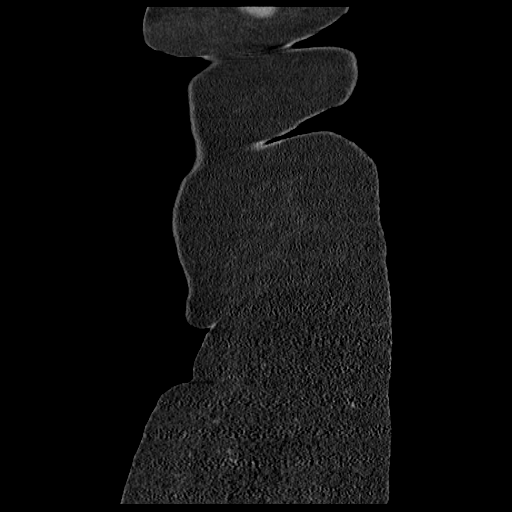

[13 of 32 positions shown; findings below may reference images not displayed]

FINDINGS: The lung bases are clear.  The liver, spleen,
gallbladder, pancreas, bile ducts, and adrenal glands are
unremarkable.  The stomach is unremarkable.  The small bowel is not
well distended but there is no evidence of any small bowel
dilatation.  Contrast material and scattered stool throughout the
colon without wall thickening or dilatation.  There is a small
ventral hernia containing transverse colon without proximal
obstruction or wall thickening.  The appendix is normal.

The uterus is anteverted in is not enlarged.  On the right side,
between the uterus and rectum, there is a loculated cystic
structure measuring 3.1 x 4.2 cm.  I believe that this most likely
represents an ovarian cyst, but this is in the location of one of
the previous pelvic drains and a recurrent abscess is not entirely
excluded.  Ultrasound is suggested for further evaluation.  No
other inflammatory changes or fluid collections are identified.  No
significant lymphadenopathy.  Visualized bones  are grossly
unremarkable.
IMPRESSION: 1.  Small ventral midline abdominal wall hernia containing colon
without evidence of obstruction.
2.  3 x 4 cm cystic fluid collection in the right posterior pelvis
probably represents a right ovarian cyst.  Recurrent abscess is not
entirely excluded although less likely.  Ultrasound recommended for
further evaluation

## 2011-05-28 ENCOUNTER — Other Ambulatory Visit (HOSPITAL_COMMUNITY)
Admission: RE | Admit: 2011-05-28 | Discharge: 2011-05-28 | Disposition: A | Discharge status: Home / Self Care | Payer: Medicaid Other | Source: Ambulatory Visit | Attending: Family Medicine | Admitting: Family Medicine

## 2011-05-28 ENCOUNTER — Encounter: Payer: Self-pay | Admitting: Family Medicine

## 2011-05-28 ENCOUNTER — Ambulatory Visit (INDEPENDENT_AMBULATORY_CARE_PROVIDER_SITE_OTHER): Payer: Medicaid Other | Admitting: Family Medicine

## 2011-05-28 VITALS — BP 113/85 | HR 86 | Temp 98.7°F | Ht 65.75 in | Wt 245.5 lb

## 2011-05-28 DIAGNOSIS — Z01419 Encounter for gynecological examination (general) (routine) without abnormal findings: Secondary | ICD-10-CM | POA: Insufficient documentation

## 2011-05-28 DIAGNOSIS — Z124 Encounter for screening for malignant neoplasm of cervix: Secondary | ICD-10-CM

## 2011-05-28 DIAGNOSIS — F17200 Nicotine dependence, unspecified, uncomplicated: Secondary | ICD-10-CM

## 2011-05-28 DIAGNOSIS — E663 Overweight: Secondary | ICD-10-CM

## 2011-05-28 LAB — CBC WITH DIFFERENTIAL/PLATELET
Basophils Absolute: 0 10*3/uL (ref 0.0–0.1)
Eosinophils Relative: 2 % (ref 0–5)
HCT: 39.5 % (ref 36.0–46.0)
Hemoglobin: 12.8 g/dL (ref 12.0–15.0)
Lymphocytes Relative: 39 % (ref 12–46)
Lymphs Abs: 4.4 10*3/uL — ABNORMAL HIGH (ref 0.7–4.0)
MCV: 83.5 fL (ref 78.0–100.0)
Monocytes Absolute: 0.7 10*3/uL (ref 0.1–1.0)
Monocytes Relative: 6 % (ref 3–12)
Neutro Abs: 5.9 10*3/uL (ref 1.7–7.7)
RBC: 4.73 MIL/uL (ref 3.87–5.11)
RDW: 14.3 % (ref 11.5–15.5)
WBC: 11.3 10*3/uL — ABNORMAL HIGH (ref 4.0–10.5)

## 2011-05-28 LAB — POCT GLYCOSYLATED HEMOGLOBIN (HGB A1C): Hemoglobin A1C: 5.4

## 2011-05-28 MED ORDER — NICOTINE 7 MG/24HR TD PT24: 1.0000 | MEDICATED_PATCH | TRANSDERMAL | Status: AC

## 2011-05-28 NOTE — Patient Instructions (Signed)
Great that you want to quit smoking! 1-800-Quit Now has good resources. Lets start with low dose patch daily Make an appointment with Dr. Raymondo Band in pharmacy clinic to discuss medications. I will call if your lab tests are abnormal.   Smoking Cessation, Tips for Success YOU CAN QUIT SMOKING If you are ready to quit smoking, congratulations! You have chosen to help yourself be healthier. Cigarettes bring nicotine, tar, carbon monoxide, and other irritants into your body. Your lungs, heart, and blood vessels will be able to work better without these poisons. There are many different ways to quit smoking. Nicotine gum, nicotine patches, a nicotine inhaler, or nicotine nasal spray can help with physical craving. Hypnosis, support groups, and medicines help break the habit of smoking. Here are some tips to help you quit for good.  Throw away all cigarettes.   Clean and remove all ashtrays from your home, work, and car.   On a card, write down your reasons for quitting. Carry the card with you and read it when you get the urge to smoke.   Cleanse your body of nicotine. Drink enough water and fluids to keep your urine clear or pale yellow. Do this after quitting to flush the nicotine from your body.   Learn to predict your moods. Do not let a bad situation be your excuse to have a cigarette. Some situations in your life might tempt you into wanting a cigarette.   Never have "just one" cigarette. It leads to wanting another and another. Remind yourself of your decision to quit.   Change habits associated with smoking. If you smoked while driving or when feeling stressed, try other activities to replace smoking. Stand up when drinking your coffee. Brush your teeth after eating. Sit in a different chair when you read the paper. Avoid alcohol while trying to quit, and try to drink fewer caffeinated beverages. Alcohol and caffeine may urge you to smoke.   Avoid foods and drinks that can trigger a desire  to smoke, such as sugary or spicy foods and alcohol.   Ask people who smoke not to smoke around you.   Have something planned to do right after eating or having a cup of coffee. Take a walk or exercise to perk you up. This will help to keep you from overeating.   Try a relaxation exercise to calm you down and decrease your stress. Remember, you may be tense and nervous for the first 2 weeks after you quit, but this will pass.   Find new activities to keep your hands busy. Play with a pen, coin, or rubber band. Doodle or draw things on paper.   Brush your teeth right after eating. This will help cut down on the craving for the taste of tobacco after meals. You can try mouthwash, too.   Use oral substitutes, such as lemon drops, carrots, a cinnamon stick, or chewing gum, in place of cigarettes. Keep them handy so they are available when you have the urge to smoke.   When you have the urge to smoke, try deep breathing.   Designate your home as a nonsmoking area.   If you are a heavy smoker, ask your caregiver about a prescription for nicotine chewing gum. It can ease your withdrawal from nicotine.   Reward yourself. Set aside the cigarette money you save and buy yourself something nice.   Look for support from others. Join a support group or smoking cessation program. Ask someone at home or at work  to help you with your plan to quit smoking.   Always ask yourself, "Do I need this cigarette or is this just a reflex?" Tell yourself, "Today, I choose not to smoke," or "I do not want to smoke." You are reminding yourself of your decision to quit, even if you do smoke a cigarette.  HOW WILL I FEEL WHEN I QUIT SMOKING?  The benefits of not smoking start within days of quitting.   You may have symptoms of withdrawal because your body is used to nicotine (the addictive substance in cigarettes). You may crave cigarettes, be irritable, feel very hungry, cough often, get headaches, or have difficulty  concentrating.   The withdrawal symptoms are only temporary. They are strongest when you first quit but will go away within 10 to 14 days.   When withdrawal symptoms occur, stay in control. Think about your reasons for quitting. Remind yourself that these are signs that your body is healing and getting used to being without cigarettes.   Remember that withdrawal symptoms are easier to treat than the major diseases that smoking can cause.   Even after the withdrawal is over, expect periodic urges to smoke. However, these cravings are generally short-lived and will go away whether you smoke or not. Do not smoke!   If you relapse and smoke again, do not lose hope. Most smokers quit 3 times before they are successful.   If you relapse, do not give up! Plan ahead and think about what you will do the next time you get the urge to smoke.  LIFE AS A NONSMOKER: MAKE IT FOR A MONTH, MAKE IT FOR LIFE Day 1: Hang this page where you will see it every day. Day 2: Get rid of all ashtrays, matches, and lighters. Day 3: Drink water. Breathe deeply between sips. Day 4: Avoid places with smoke-filled air, such as bars, clubs, or the smoking section of restaurants. Day 5: Keep track of how much money you save by not smoking. Day 6: Avoid boredom. Keep a good book with you or go to the movies. Day 7: Reward yourself! One week without smoking! Day 8: Make a dental appointment to get your teeth cleaned. Day 9: Decide how you will turn down a cigarette before it is offered to you. Day 10: Review your reasons for quitting. Day 11: Distract yourself. Stay active to keep your mind off smoking and to relieve tension. Take a walk, exercise, read a book, do a crossword puzzle, or try a new hobby. Day 12: Exercise. Get off the bus before your stop or use stairs instead of escalators. Day 13: Call on friends for support and encouragement. Day 14: Reward yourself! Two weeks without smoking! Day 15: Practice deep  breathing exercises. Day 16: Bet a friend that you can stay a nonsmoker. Day 17: Ask to sit in nonsmoking sections of restaurants. Day 18: Hang up "No Smoking" signs. Day 19: Think of yourself as a nonsmoker. Day 20: Each morning, tell yourself you will not smoke. Day 21: Reward yourself! Three weeks without smoking! Day 22: Think of smoking in negative ways. Remember how it stains your teeth, gives you bad breath, and leaves you short of breath. Day 23: Eat a nutritious breakfast. Day 24:Do not relive your days as a smoker. Day 25: Hold a pencil in your hand when talking on the telephone. Day 26: Tell all your friends you do not smoke. Day 27: Think about how much better food tastes. Day 28: Remember, one  cigarette is one too many. Day 29: Take up a hobby that will keep your hands busy. Day 30: Congratulations! One month without smoking! Give yourself a big reward. Your caregiver can direct you to community resources or hospitals for support, which may include:  Group support.   Education.   Hypnosis.   Subliminal therapy.  Document Released: 10/12/2003 Document Revised: 01/02/2011 Document Reviewed: 10/30/2008 [facility] Patient Information 2012 [place], [place].

## 2011-05-29 ENCOUNTER — Encounter: Payer: Self-pay | Admitting: Family Medicine

## 2011-05-29 LAB — COMPREHENSIVE METABOLIC PANEL
Albumin: 4.1 g/dL (ref 3.5–5.2)
Alkaline Phosphatase: 69 U/L (ref 39–117)
BUN: 10 mg/dL (ref 6–23)
Calcium: 9.2 mg/dL (ref 8.4–10.5)
Chloride: 107 mEq/L (ref 96–112)
Glucose, Bld: 73 mg/dL (ref 70–99)
Potassium: 4.2 mEq/L (ref 3.5–5.3)
Sodium: 139 mEq/L (ref 135–145)
Total Protein: 7.6 g/dL (ref 6.0–8.3)

## 2011-05-29 LAB — RPR

## 2011-05-29 NOTE — Progress Notes (Signed)
  Subjective:     Helen Stone is a 36 y.o. female and is here for a comprehensive physical exam. The patient reports would like HIV screening. Has regular menstrual cycles. Not sexually active > 48yr. Has anogenital wart hx and would like to come back for treatment.   History   Social History  . Marital Status: Single    Spouse Name: N/A    Number of Children: N/A  . Years of Education: N/A   Occupational History  . Not on file.   Social History Main Topics  . Smoking status: Current Everyday Smoker -- 0.5 packs/day    Types: Cigarettes  . Smokeless tobacco: Never Used  . Alcohol Use: No  . Drug Use: No  . Sexually Active: Not on file   Other Topics Concern  . Not on file   Social History Narrative  . No narrative on file   Health Maintenance  Topic Date Due  . Pap Smear  10/28/1993  . Influenza Vaccine  10/28/2011  . Tetanus/tdap  11/30/2012    The following portions of the patient's history were reviewed and updated as appropriate: allergies, current medications, past family history, past medical history, past social history, past surgical history and problem list.  Review of Systems Pertinent items are noted in HPI.   Objective:    BP 113/85  Pulse 86  Temp(Src) 98.7 F (37.1 C) (Oral)  Ht 5' 5.75" (1.67 m)  Wt 245 lb 8 oz (111.358 kg)  BMI 39.93 kg/m2 General appearance: alert, cooperative and appears stated age Head: Normocephalic, without obvious abnormality, atraumatic Eyes: conjunctivae/corneas clear. PERRL, EOM's intact.  Throat: lips, mucosa, and tongue normal; teeth and gums normal Neck: no adenopathy and supple, symmetrical, trachea midline Lungs: clear to auscultation bilaterally Heart: regular rate and rhythm, S1, S2 normal, no murmur, click, rub or gallop Abdomen: soft, non-tender; bowel sounds normal; no masses,  no organomegaly Pelvic: cervix normal in appearance, exam obscured by obesity, external genitalia normal, no adnexal masses or  tenderness, no cervical motion tenderness, rectovaginal septum normal, uterus normal size, shape, and consistency and vagina normal without discharge; single moderate sized wart on right perianal region, non irritated or bleeding. Extremities: extremities normal, atraumatic, no cyanosis or edema Skin: Skin color, texture, turgor normal. No rashes or lesions Neurologic: Grossly normal    Assessment:    Healthy female exam. Hx abnormal pap with cryo.  Obesity   Plan:     See After Visit Summary for Counseling Recommendations  1. Overweight  HgB A1c, CBC with Differential, LDL Cholesterol, Direct, Comprehensive metabolic panel, HIV Antibody, RPR  2. Current smoker  nicotine (NICODERM CQ - DOSED IN MG/24 HR) 7 mg/24hr patch Advised to schedule f/u with PCP or Dr. Raymondo Band.   3. Screening for malignant neoplasm of the cervix  Cytology - PAP   4. Condyloma. Patient to return for treatment of perianal wart.

## 2011-06-04 ENCOUNTER — Encounter: Payer: Self-pay | Admitting: Family Medicine

## 2011-08-29 ENCOUNTER — Encounter (HOSPITAL_COMMUNITY): Payer: Self-pay | Admitting: Emergency Medicine

## 2011-08-29 DIAGNOSIS — R109 Unspecified abdominal pain: Secondary | ICD-10-CM | POA: Insufficient documentation

## 2011-08-29 LAB — URINALYSIS, ROUTINE W REFLEX MICROSCOPIC
Bilirubin Urine: NEGATIVE
Glucose, UA: NEGATIVE mg/dL
Ketones, ur: NEGATIVE mg/dL
Nitrite: NEGATIVE
Protein, ur: NEGATIVE mg/dL
pH: 6 (ref 5.0–8.0)

## 2011-08-29 LAB — URINE MICROSCOPIC-ADD ON

## 2011-08-29 NOTE — ED Notes (Signed)
C/o L side pain since November 2012.  Denies any other symptoms.

## 2011-08-30 ENCOUNTER — Emergency Department (HOSPITAL_COMMUNITY)
Admission: EM | Admit: 2011-08-30 | Discharge: 2011-08-30 | Discharge status: Against Medical Advice | Payer: Self-pay | Attending: Emergency Medicine | Admitting: Emergency Medicine

## 2011-08-30 NOTE — ED Notes (Signed)
Called for pt with no answer 

## 2011-09-18 ENCOUNTER — Encounter (HOSPITAL_COMMUNITY): Payer: Self-pay | Admitting: Physical Medicine and Rehabilitation

## 2011-09-18 ENCOUNTER — Emergency Department (HOSPITAL_COMMUNITY)
Admission: EM | Admit: 2011-09-18 | Discharge: 2011-09-18 | Disposition: A | Discharge status: Home / Self Care | Payer: Self-pay | Attending: Emergency Medicine | Admitting: Emergency Medicine

## 2011-09-18 DIAGNOSIS — L0231 Cutaneous abscess of buttock: Secondary | ICD-10-CM | POA: Insufficient documentation

## 2011-09-18 DIAGNOSIS — L03317 Cellulitis of buttock: Secondary | ICD-10-CM | POA: Insufficient documentation

## 2011-09-18 DIAGNOSIS — F17200 Nicotine dependence, unspecified, uncomplicated: Secondary | ICD-10-CM | POA: Insufficient documentation

## 2011-09-18 MED ORDER — SODIUM CHLORIDE 0.9 % IV BOLUS (SEPSIS): 1000.0000 mL | Freq: Once | INTRAVENOUS | Status: DC

## 2011-09-18 NOTE — ED Provider Notes (Signed)
History     CSN: [id number]  Arrival date & time 09/18/11  0741   First MD Initiated Contact with Patient 09/18/11 [phone]      Chief Complaint  Patient presents with  . Abscess    HPI 36 yo female who presents with "boil" on right side of bottom, present for 1.5 week. More painful in the last couple of days. Pain radiates to right side of bottom.  No fevers or chills. Did not notice any obvious drainage although felt like swelling was reduced after taking warm shower.   Past Medical History  Diagnosis Date  . Solitary kidney   . Renal calculi   . Depression   . Pelvic abscess in female   . Dysplasia of cervix, low grade (CIN 1)     2007 colpo    Past Surgical History  Procedure Date  . Nephrectomy   . Colectomy 2010  . Colon surgery 2010    colostomy takedown  . Dilation and curettage of uterus     x2  . Cervix lesion destruction 2007    Family History  Problem Relation Age of Onset  . Hypertension Mother   . Diabetes Mother   . Diabetes Maternal Aunt   . Heart disease Maternal Aunt   . Hypertension Maternal Aunt   . HIV Father   . Heart disease Maternal Uncle   . Heart disease Maternal Grandmother   . Diabetes Maternal Grandmother     History  Substance Use Topics  . Smoking status: Current Everyday Smoker -- 0.5 packs/day    Types: Cigarettes  . Smokeless tobacco: Never Used  . Alcohol Use: No    OB History    Grav Para Term Preterm Abortions TAB SAB Ect Mult Living                  Review of Systems  All other systems reviewed and are negative.    Allergies  Mirtazapine  Home Medications  No current outpatient prescriptions on file.  BP 105/65  Pulse 70  Temp 98.4 F (36.9 C) (Oral)  Resp 18  SpO2 99%  LMP 08/27/2011  Physical Exam  Constitutional: She appears well-developed. No distress.  Skin:       Right gluteal fold abscess with fluctuance of 3cm in diameter, with induration surrounding it. tenderness to palpation.     ED  Course  Procedures   INCISION AND DRAINAGE Performed by: Marena Chancy Consent: Verbal consent obtained. Risks and benefits: risks, benefits and alternatives were discussed Time out performed prior to procedure Type: abscess Body area: right gluteal fold Anesthesia: local infiltration Local anesthetic: lidocaine 2% with epinephrine Anesthetic total: 4 ml Complexity: complex Blunt dissection to break up loculations Drainage: purulent Drainage amount: 10-15 mls of pus and blood Packing material: 1/4 packing strip Patient tolerance: Patient had vaso vagal episode with light headedness and drop in blood pressure. Patient felt better after laying down and drinking juice. She tolerated the rest of the procedure well.   Labs Reviewed - No data to display No results found.   1. Abscess of right buttock     MDM   I&D of right gluteal abscess.  Patient discharged home in stable medical condition with advil for pain and instructions to keep area clean and dry.        Lonia Skinner, MD 09/18/11 [phone]

## 2011-09-18 NOTE — ED Notes (Signed)
Pt states raised, swollen and painful area to top of R buttock area. Ongoing x1 week. No open wounds noted, no drainage noted. Area noted to be red and warm to touch. Denies fever. 8/10 pain at the time.

## 2011-09-18 NOTE — ED Notes (Signed)
Checked patient blood sugar it was 119 notified RN of blood sugar 

## 2011-09-18 NOTE — ED Notes (Signed)
Pt tolerated I&D without difficulty. Vital signs stable. To be discharged home.

## 2011-09-18 NOTE — ED Notes (Addendum)
Pt presents to department for evaluation of abscess to R upper buttock. Ongoing x1 week. States pain became worse today. 8/10 pain at the time. No drainage noted from site. She is conscious alert and oriented x4. No signs of distress noted.

## 2011-09-18 NOTE — ED Notes (Signed)
Provider at the bedside performing I&D at the time. Pt tolerating without difficulty. Will re-check BP when patient is able to sit up. Pt remains alert and oriented x4.

## 2011-09-18 NOTE — ED Provider Notes (Signed)
I saw and evaluated the patient, reviewed the resident's note and I agree with the findings and plan.  Patient with abscess of the right superior gluteal cleft. Area of fluctuance and tenderness. No significant surrounding erythema. There is some induration noted. She does not have a fever or appear to be systemically ill. I&D was performed by the resident physician under my supervision. I was available for abortions at the procedure. Please see her note for other details. Of note the patient did have what appears to be a vagal event during the procedure. She did have a brief syncopal episode but was not preceded or concurrent with any chest pain, palpitations, abdominal pain or severe headache. Patient's symptoms of the vagal event seemed to have resolved and she was observed for a period in the emergency department without any subsequent problems.  Gavin Pound. Elga Santy, MD 09/18/11 1051

## 2012-02-10 ENCOUNTER — Telehealth: Payer: Self-pay | Admitting: Family Medicine

## 2012-02-10 NOTE — Telephone Encounter (Signed)
Pt needs a letter stating when she had a physical and there was no problems or abnormalities.  She needs this for her job. pls call when ready

## 2012-02-11 ENCOUNTER — Encounter: Payer: Self-pay | Admitting: Family Medicine

## 2012-02-11 NOTE — Telephone Encounter (Signed)
I have created letter, routed to ADMIN to print and notify patient. thanks

## 2012-02-17 NOTE — Telephone Encounter (Signed)
Did this get done

## 2012-04-26 ENCOUNTER — Ambulatory Visit (INDEPENDENT_AMBULATORY_CARE_PROVIDER_SITE_OTHER): Payer: BC Managed Care – PPO | Admitting: Family Medicine

## 2012-04-26 ENCOUNTER — Encounter: Payer: Self-pay | Admitting: Family Medicine

## 2012-04-26 VITALS — BP 129/81 | HR 75 | Temp 99.2°F | Ht 65.75 in | Wt 233.7 lb

## 2012-04-26 DIAGNOSIS — J020 Streptococcal pharyngitis: Secondary | ICD-10-CM

## 2012-04-26 DIAGNOSIS — J029 Acute pharyngitis, unspecified: Secondary | ICD-10-CM | POA: Insufficient documentation

## 2012-04-26 LAB — POCT RAPID STREP A (OFFICE): Rapid Strep A Screen: NEGATIVE

## 2012-04-26 MED ORDER — AMOXICILLIN 500 MG PO CAPS: 500.0000 mg | ORAL_CAPSULE | Freq: Three times a day (TID) | ORAL | Status: DC

## 2012-04-26 NOTE — Progress Notes (Signed)
  Subjective:    Patient ID: Helen Stone, female    DOB: [birth year], 37 y.o.   MRN: [id number]  Sore Throat    1. Sore throat. Started 5 days ago and getting worse. Dry cough. Feels like briar in her throat. She is clearing throat a lot. Pain with swallowing and feels like she has to chew food extra amount to swallow it well. Pain seems improved in the morning, then worsens as the days goes on. Has tried multiple remedies-honey, motrin, chloraseptic, cough drops without improvement.  No sick contacts.   Sexual history reveals she received oral sex with new partner in past month.    Review of Systems No fever, dyspnea, stridor, wheezing, injury, facial pain, headache, chills, neck pain/stiffness, sputum production, nasal rhinorrhea, heartburn, indigestion, choking, drooling, rash.     Objective:   Physical Exam  Vitals reviewed. Constitutional: She is oriented to person, place, and time. She appears well-developed and well-nourished. No distress.  HENT:  Head: Normocephalic and atraumatic.  Right Ear: External ear normal.  Left Ear: External ear normal.  Mouth/Throat: Oropharynx is clear and moist.  Left nostril with enlarged turbinate ?polyp. Very slight tonsillar hypertrophy, no asymmetry. No significant erythema or exudate.  Airway is patent, no stridor. No LAD.  No sinus TTP.  Eyes: EOM are normal. Pupils are equal, round, and reactive to light.  Neck: Normal range of motion. Neck supple. No thyromegaly present.  Cardiovascular: Normal rate, regular rhythm and normal heart sounds.   No murmur heard. Pulmonary/Chest: Effort normal and breath sounds normal. No stridor. No respiratory distress. She has no wheezes. She has no rales.  Lymphadenopathy:    She has no cervical adenopathy.  Neurological: She is alert and oriented to person, place, and time.  Skin: She is not diaphoretic.          Assessment & Plan:

## 2012-04-26 NOTE — Assessment & Plan Note (Signed)
Most likely viral syndrome with dry cough, negative rapid strep and no LAD or fever. Since this is worsening on day 5, have given rx for amoxicillin to take if does not improve in next 2-3 days for possible bacterial pharyngitis. Additionally, will screen for oro-pharyngeal GC/Ch since she has possible exposure history.

## 2012-04-26 NOTE — Patient Instructions (Signed)
I think you have a virus causing sore throat.  If you have fever, chills, worsening by Wednesday, then start taking amoxcillin. I will call if labs come back showing any other infection.  Strep Throat Strep throat is an infection of the throat caused by a bacteria named Streptococcus pyogenes. Your caregiver may call the infection streptococcal "tonsillitis" or "pharyngitis" depending on whether there are signs of inflammation in the tonsils or back of the throat. Strep throat is most common in children from 61 to 42 years old during the cold months of the year, but it can occur in people of any age during any season. This infection is spread from person to person (contagious) through coughing, sneezing, or other close contact. SYMPTOMS   Fever or chills.  Painful, swollen, red tonsils or throat.  Pain or difficulty when swallowing.  White or yellow spots on the tonsils or throat.  Swollen, tender lymph nodes or "glands" of the neck or under the jaw.  Red rash all over the body (rare). DIAGNOSIS  Many different infections can cause the same symptoms. A test must be done to confirm the diagnosis so the right treatment can be given. A "rapid strep test" can help your caregiver make the diagnosis in a few minutes. If this test is not available, a light swab of the infected area can be used for a throat culture test. If a throat culture test is done, results are usually available in a day or two. TREATMENT  Strep throat is treated with antibiotic medicine. HOME CARE INSTRUCTIONS   Gargle with 1 tsp of salt in 1 cup of warm water, 3 to 4 times per day or as needed for comfort.  Family members who also have a sore throat or fever should be tested for strep throat and treated with antibiotics if they have the strep infection.  Make sure everyone in your household washes their hands well.  Do not share food, drinking cups, or personal items that could cause the infection to spread to  others.  You may need to eat a soft food diet until your sore throat gets better.  Drink enough water and fluids to keep your urine clear or pale yellow. This will help prevent dehydration.  Get plenty of rest.  Stay home from school, daycare, or work until you have been on antibiotics for 24 hours.  Only take over-the-counter or prescription medicines for pain, discomfort, or fever as directed by your caregiver.  If antibiotics are prescribed, take them as directed. Finish them even if you start to feel better. SEEK MEDICAL CARE IF:   The glands in your neck continue to enlarge.  You develop a rash, cough, or earache.  You cough up green, yellow-brown, or bloody sputum.  You have pain or discomfort not controlled by medicines.  Your problems seem to be getting worse rather than better. SEEK IMMEDIATE MEDICAL CARE IF:   You develop any new symptoms such as vomiting, severe headache, stiff or painful neck, chest pain, shortness of breath, or trouble swallowing.  You develop severe throat pain, drooling, or changes in your voice.  You develop swelling of the neck, or the skin on the neck becomes red and tender.  You have a fever.  You develop signs of dehydration, such as fatigue, dry mouth, and decreased urination.  You become increasingly sleepy, or you cannot wake up completely. Document Released: 01/11/2000 Document Revised: 04/07/2011 Document Reviewed: 03/14/2010 [facility] Patient Information 2013 [place], [place].

## 2012-04-29 LAB — CHLAMYDIA CULTURE

## 2012-05-04 ENCOUNTER — Telehealth: Payer: Self-pay | Admitting: Family Medicine

## 2012-05-04 NOTE — Telephone Encounter (Signed)
Left message on patient's voicemail.Helen Stone S  

## 2012-05-04 NOTE — Telephone Encounter (Signed)
Please notify patient her lab tests for infection came back normal. If her throat soreness doesn't get better, please return to clinic.

## 2012-05-31 ENCOUNTER — Ambulatory Visit (INDEPENDENT_AMBULATORY_CARE_PROVIDER_SITE_OTHER): Payer: BC Managed Care – PPO | Admitting: Family Medicine

## 2012-05-31 ENCOUNTER — Encounter: Payer: Self-pay | Admitting: Family Medicine

## 2012-05-31 VITALS — BP 117/83 | HR 89 | Temp 98.7°F | Ht 67.0 in | Wt 228.2 lb

## 2012-05-31 DIAGNOSIS — H109 Unspecified conjunctivitis: Secondary | ICD-10-CM

## 2012-05-31 MED ORDER — POLYMYXIN B-TRIMETHOPRIM 10000-0.1 UNIT/ML-% OP SOLN: OPHTHALMIC | Status: DC

## 2012-05-31 NOTE — Patient Instructions (Addendum)
Worrisome symptoms: -poor vision  -eye pain   Conjunctivitis Conjunctivitis is commonly called "pink eye." Conjunctivitis can be caused by bacterial or viral infection, allergies, or injuries. There is usually redness of the lining of the eye, itching, discomfort, and sometimes discharge. There may be deposits of matter along the eyelids. A viral infection usually causes a watery discharge, while a bacterial infection causes a yellowish, thick discharge. Pink eye is very contagious and spreads by direct contact. You may be given antibiotic eyedrops as part of your treatment. Before using your eye medicine, remove all drainage from the eye by washing gently with warm water and cotton balls. Continue to use the medication until you have awakened 2 mornings in a row without discharge from the eye. Do not rub your eye. This increases the irritation and helps spread infection. Use separate towels from other household members. Wash your hands with soap and water before and after touching your eyes. Use cold compresses to reduce pain and sunglasses to relieve irritation from light. Do not wear contact lenses or wear eye makeup until the infection is gone. SEEK MEDICAL CARE IF:   Your symptoms are not better after 3 days of treatment.  You have increased pain or trouble seeing.  The outer eyelids become very red or swollen.

## 2012-05-31 NOTE — Progress Notes (Signed)
  Subjective:    Patient ID: Helen Stone, female    DOB: Sep 04, 1975, 37 y.o.   MRN: [id number]  HPI # ? Pink eye  She went to work yesterday and thinks she got pink eye there. It started in her left eye and then this morning, it had spread to her right eye and her eyelids were shut closed with purulent drainage. It keeps draining every 5 minutes.  She works for a [employer]. She denies being around kids, sick people, trauma to eye, eye irritants (she wears goggles and masks at work)  Review of Systems Denies fevers, chills, vision problems, cough/cold symptoms (no sore throat, ear pain, cough, dyspnea)  Allergies, medication, past medical history reviewed.  Smoking status noted. 1/2 ppd     Objective:   Physical Exam GEN: NAD; well-nourished, -appearing HEENT:   Head: [place]/AT   Eyes: erythematous conjunctiva bilaterally with scant purulent drainage; vision grossly intact   Nose: no rhinorrhea, normal turbinates   Mouth: MMM; no tonsillar adenopathy; no oropharyngeal erythema NECK: no LAD PULM: NI WOB      Assessment & Plan:

## 2012-05-31 NOTE — Assessment & Plan Note (Signed)
Symptoms past 2 days, started one eye now both involved with purulent drainage.  -Polytrim eye drops, 5-7 days -Follow-up as needed. See AVS regarding worrisome symptoms.

## 2012-06-09 ENCOUNTER — Encounter: Payer: BC Managed Care – PPO | Admitting: Family Medicine

## 2012-07-07 ENCOUNTER — Encounter: Payer: Self-pay | Admitting: Family Medicine

## 2012-07-07 ENCOUNTER — Ambulatory Visit (INDEPENDENT_AMBULATORY_CARE_PROVIDER_SITE_OTHER): Payer: BC Managed Care – PPO | Admitting: Family Medicine

## 2012-07-07 ENCOUNTER — Other Ambulatory Visit (HOSPITAL_COMMUNITY)
Admission: RE | Admit: 2012-07-07 | Discharge: 2012-07-07 | Disposition: A | Discharge status: Home / Self Care | Payer: BC Managed Care – PPO | Source: Ambulatory Visit | Attending: Family Medicine | Admitting: Family Medicine

## 2012-07-07 VITALS — BP 113/80 | HR 73 | Ht 67.0 in | Wt 225.0 lb

## 2012-07-07 DIAGNOSIS — Z113 Encounter for screening for infections with a predominantly sexual mode of transmission: Secondary | ICD-10-CM | POA: Insufficient documentation

## 2012-07-07 DIAGNOSIS — Z0000 Encounter for general adult medical examination without abnormal findings: Secondary | ICD-10-CM

## 2012-07-07 NOTE — Progress Notes (Signed)
  Subjective:     Helen Stone is a 37 y.o. female and is here for a comprehensive physical exam. The patient reports no problems.  She had history of CIN-1, colpo and ablation in 2007. Has all normal pap since that time including last year.   Has normal periods, last was 3-4 weeks ago.   Reports some intermittent bilateral nipple itching, no discharge, lumps, pain or rashes. No family history of cancer noted.   She has lost about 20 lbs in past year with trying some dietary changes. Her job requires some physical activity also.   Trying to quit smoking by cold [place] method. Is smoking 6-8 cigarettes daily. Marijuana use history.   Body mass index is 35.23 kg/(m^2).   History   Social History  . Marital Status: Single    Spouse Name: N/A    Number of Children: N/A  . Years of Education: N/A   Occupational History  . Not on file.   Social History Main Topics  . Smoking status: Current Every Day Smoker -- 0.50 packs/day    Types: Cigarettes  . Smokeless tobacco: Never Used  . Alcohol Use: No  . Drug Use: No  . Sexually Active: Not on file   Other Topics Concern  . Not on file   Social History Narrative  . No narrative on file   Health Maintenance  Topic Date Due  . Influenza Vaccine  09/27/2012  . Tetanus/tdap  11/30/2012  . Pap Smear  05/29/2014    The following portions of the patient's history were reviewed and updated as appropriate: allergies, current medications, past family history, past medical history, past social history, past surgical history and problem list.  Review of Systems A comprehensive review of systems was negative.   Objective:    BP 113/80  Pulse 73  Ht 5\' 7"  (1.702 m)  Wt 225 lb (102.059 kg)  BMI 35.23 kg/m2 General appearance: alert, cooperative and appears stated age Head: Normocephalic, without obvious abnormality, atraumatic Eyes: conjunctivae/corneas clear. PERRL, EOM's intact Nose: Nares normal. Septum midline. Mucosa normal.  No drainage or sinus tenderness. Throat: lips, mucosa, and tongue normal; teeth and gums normal Neck: no adenopathy, supple, symmetrical, trachea midline and thyroid not enlarged, symmetric, no tenderness/mass/nodules Lungs: clear to auscultation bilaterally Breasts: normal appearance, no masses or tenderness Heart: regular rate and rhythm, S1, S2 normal, no murmur, click, rub or gallop Abdomen: soft, non-tender; bowel sounds normal; no masses,  no organomegaly Extremities: extremities normal, atraumatic, no cyanosis or edema Pulses: 2+ and symmetric Neurologic: Alert and oriented X 3, normal strength and tone. Normal symmetric reflexes. Normal coordination and gait    Assessment:    Healthy female exam. Body mass index is 35.23 kg/(m^2). Tobacco abuse.      Plan:     See After Visit Summary for Counseling Recommendations   Patient refused medication today, we discussed other options for smoking cessation, provided tips.   Discussed healthy BMI and encouraged continued weight loss efforts. Her fasting labs and A1c were normal last year. F/u in one year.  Hx abnormal pap. Recent in past 6-7 years after colposcopy. Had three normal in past 4 years. Recommend she get repeat next year with HPV co-testing.

## 2012-07-07 NOTE — Patient Instructions (Addendum)
Nice to see you. Keep working on your weight loss for good health. Body mass index is 35.23 kg/(m^2). Keep working on quitting smoking! If you decide you need help, let us know. May try 1-800 QuitNow. Due for pap in one year.  Make sure to get plenty of calcium in your diet.      Smoking Cessation, Tips for Success YOU CAN QUIT SMOKING If you are ready to quit smoking, congratulations! You have chosen to help yourself be healthier. Cigarettes bring nicotine, tar, carbon monoxide, and other irritants into your body. Your lungs, heart, and blood vessels will be able to work better without these poisons. There are many different ways to quit smoking. Nicotine gum, nicotine patches, a nicotine inhaler, or nicotine nasal spray can help with physical craving. Hypnosis, support groups, and medicines help break the habit of smoking. Here are some tips to help you quit for good.  Throw away all cigarettes.  Clean and remove all ashtrays from your home, work, and car.  On a card, write down your reasons for quitting. Carry the card with you and read it when you get the urge to smoke.  Cleanse your body of nicotine. Drink enough water and fluids to keep your urine clear or pale yellow. Do this after quitting to flush the nicotine from your body.  Learn to predict your moods. Do not let a bad situation be your excuse to have a cigarette. Some situations in your life might tempt you into wanting a cigarette.  Never have "just one" cigarette. It leads to wanting another and another. Remind yourself of your decision to quit.  Change habits associated with smoking. If you smoked while driving or when feeling stressed, try other activities to replace smoking. Stand up when drinking your coffee. Brush your teeth after eating. Sit in a different chair when you read the paper. Avoid alcohol while trying to quit, and try to drink fewer caffeinated beverages. Alcohol and caffeine may urge you to smoke.  Avoid  foods and drinks that can trigger a desire to smoke, such as sugary or spicy foods and alcohol.  Ask people who smoke not to smoke around you.  Have something planned to do right after eating or having a cup of coffee. Take a walk or exercise to perk you up. This will help to keep you from overeating.  Try a relaxation exercise to calm you down and decrease your stress. Remember, you may be tense and nervous for the first 2 weeks after you quit, but this will pass.  Find new activities to keep your hands busy. Play with a pen, coin, or rubber band. Doodle or draw things on paper.  Brush your teeth right after eating. This will help cut down on the craving for the taste of tobacco after meals. You can try mouthwash, too.  Use oral substitutes, such as lemon drops, carrots, a cinnamon stick, or chewing gum, in place of cigarettes. Keep them handy so they are available when you have the urge to smoke.  When you have the urge to smoke, try deep breathing.  Designate your home as a nonsmoking area.  If you are a heavy smoker, ask your caregiver about a prescription for nicotine chewing gum. It can ease your withdrawal from nicotine.  Reward yourself. Set aside the cigarette money you save and buy yourself something nice.  Look for support from others. Join a support group or smoking cessation program. Ask someone at home or at work to  help you with your plan to quit smoking.  Always ask yourself, "Do I need this cigarette or is this just a reflex?" Tell yourself, "Today, I choose not to smoke," or "I do not want to smoke." You are reminding yourself of your decision to quit, even if you do smoke a cigarette. HOW WILL I FEEL WHEN I QUIT SMOKING?  The benefits of not smoking start within days of quitting.  You may have symptoms of withdrawal because your body is used to nicotine (the addictive substance in cigarettes). You may crave cigarettes, be irritable, feel very hungry, cough often, get  headaches, or have difficulty concentrating.  The withdrawal symptoms are only temporary. They are strongest when you first quit but will go away within 10 to 14 days.  When withdrawal symptoms occur, stay in control. Think about your reasons for quitting. Remind yourself that these are signs that your body is healing and getting used to being without cigarettes.  Remember that withdrawal symptoms are easier to treat than the major diseases that smoking can cause.  Even after the withdrawal is over, expect periodic urges to smoke. However, these cravings are generally short-lived and will go away whether you smoke or not. Do not smoke!  If you relapse and smoke again, do not lose hope. Most smokers quit 3 times before they are successful.  If you relapse, do not give up! Plan ahead and think about what you will do the next time you get the urge to smoke. LIFE AS A NONSMOKER: MAKE IT FOR A MONTH, MAKE IT FOR LIFE Day 1: Hang this page where you will see it every day. Day 2: Get rid of all ashtrays, matches, and lighters. Day 3: Drink water. Breathe deeply between sips. Day 4: Avoid places with smoke-filled air, such as bars, clubs, or the smoking section of restaurants. Day 5: Keep track of how much money you save by not smoking. Day 6: Avoid boredom. Keep a good book with you or go to the movies. Day 7: Reward yourself! One week without smoking! Day 8: Make a dental appointment to get your teeth cleaned. Day 9: Decide how you will turn down a cigarette before it is offered to you. Day 10: Review your reasons for quitting. Day 11: Distract yourself. Stay active to keep your mind off smoking and to relieve tension. Take a walk, exercise, read a book, do a crossword puzzle, or try a new hobby. Day 12: Exercise. Get off the bus before your stop or use stairs instead of escalators. Day 13: Call on friends for support and encouragement. Day 14: Reward yourself! Two weeks without smoking! Day  15: Practice deep breathing exercises. Day 16: Bet a friend that you can stay a nonsmoker. Day 17: Ask to sit in nonsmoking sections of restaurants. Day 18: Hang up "No Smoking" signs. Day 19: Think of yourself as a nonsmoker. Day 20: Each morning, tell yourself you will not smoke. Day 21: Reward yourself! Three weeks without smoking! Day 22: Think of smoking in negative ways. Remember how it stains your teeth, gives you bad breath, and leaves you short of breath. Day 23: Eat a nutritious breakfast. Day 24:Do not relive your days as a smoker. Day 25: Hold a pencil in your hand when talking on the telephone. Day 26: Tell all your friends you do not smoke. Day 27: Think about how much better food tastes. Day 28: Remember, one cigarette is one too many. Day 29: Take up a hobby  that will keep your hands busy. Day 30: Congratulations! One month without smoking! Give yourself a big reward. Your caregiver can direct you to community resources or hospitals for support, which may include:  Group support.  Education.  Hypnosis.  Subliminal therapy. Document Released: 10/12/2003 Document Revised: 04/07/2011 Document Reviewed: 10/30/2008 [facility] Patient Information 2014 [place], [place].

## 2012-07-08 ENCOUNTER — Encounter: Payer: Self-pay | Admitting: Family Medicine

## 2012-08-24 ENCOUNTER — Ambulatory Visit (INDEPENDENT_AMBULATORY_CARE_PROVIDER_SITE_OTHER): Payer: BC Managed Care – PPO | Admitting: Family Medicine

## 2012-08-24 ENCOUNTER — Encounter: Payer: Self-pay | Admitting: Family Medicine

## 2012-08-24 VITALS — BP 110/80 | HR 80 | Ht 67.0 in | Wt 227.6 lb

## 2012-08-24 DIAGNOSIS — Z9189 Other specified personal risk factors, not elsewhere classified: Secondary | ICD-10-CM

## 2012-08-24 DIAGNOSIS — E663 Overweight: Secondary | ICD-10-CM

## 2012-08-24 DIAGNOSIS — A630 Anogenital (venereal) warts: Secondary | ICD-10-CM

## 2012-08-24 DIAGNOSIS — Z202 Contact with and (suspected) exposure to infections with a predominantly sexual mode of transmission: Secondary | ICD-10-CM

## 2012-08-24 DIAGNOSIS — F17200 Nicotine dependence, unspecified, uncomplicated: Secondary | ICD-10-CM

## 2012-08-24 LAB — LIPID PANEL
HDL: 45 mg/dL (ref >39)
LDL Cholesterol: 96 mg/dL (ref 0–99)
Total CHOL/HDL Ratio: 3.4 Ratio

## 2012-08-24 NOTE — Patient Instructions (Addendum)
It was good to meet you today.  I am ordering labs you requested and will call you if anything is NOT normal. If all is normal, I will send you a letter. If you do not hear anything and are curious about results, feel free to call in.  I am glad you are thinking about quitting and have a supportive daughter who wants the same for you. I am including the tobacco quitline information and please let me know if you ever want my help with quitting, as it is the single most important thing you could do for your health.  Follow up with me as needed for management of your other health conditions.  You Can Quit Smoking If you are ready to quit smoking or are thinking about it, congratulations! You have chosen to help yourself be healthier and live longer! There are lots of different ways to quit smoking. Nicotine gum, nicotine patches, a nicotine inhaler, or nicotine nasal spray can help with physical craving. Hypnosis, support groups, and medicines help break the habit of smoking. TIPS TO GET OFF AND STAY OFF CIGARETTES  Learn to predict your moods. Do not let a bad situation be your excuse to have a cigarette. Some situations in your life might tempt you to have a cigarette.  Ask friends and co-workers not to smoke around you.  Make your home smoke-free.  Never have "just one" cigarette. It leads to wanting another and another. Remind yourself of your decision to quit.  On a card, make a list of your reasons for not smoking. Read it at least the same number of times a day as you have a cigarette. Tell yourself everyday, "I do not want to smoke. I choose not to smoke."  Ask someone at home or work to help you with your plan to quit smoking.  Have something planned after you eat or have a cup of coffee. Take a walk or get other exercise to perk you up. This will help to keep you from overeating.  Try a relaxation exercise to calm you down and decrease your stress. Remember, you may be tense and  nervous the first two weeks after you quit. This will pass.  Find new activities to keep your hands busy. Play with a pen, coin, or rubber band. Doodle or draw things on paper.  Brush your teeth right after eating. This will help cut down the craving for the taste of tobacco after meals. You can try mouthwash too.  Try gum, breath mints, or diet candy to keep something in your mouth. IF YOU SMOKE AND WANT TO QUIT:  Do not stock up on cigarettes. Never buy a carton. Wait until one pack is finished before you buy another.  Never carry cigarettes with you at work or at home.  Keep cigarettes as far away from you as possible. Leave them with someone else.  Never carry matches or a lighter with you.  Ask yourself, "Do I need this cigarette or is this just a reflex?"  Bet with someone that you can quit. Put cigarette money in a piggy bank every morning. If you smoke, you give up the money. If you do not smoke, by the end of the week, you keep the money.  Keep trying. It takes 21 days to change a habit!  Talk to your doctor about using medicines to help you quit. These include nicotine replacement gum, lozenges, or skin patches. Document Released: 11/09/2008 Document Revised: 04/07/2011 Document Reviewed: 11/09/2008 [employer]  Patient Information 2014 [employer].  

## 2012-08-24 NOTE — Assessment & Plan Note (Signed)
Pt requesting STD screening. In June had GC/Chlamydia tested via urine which were negative. Denying any symptoms except concerned about weight loss, though in the last month objectively there has been no weight loss by our records. - Testing HIV/RPR today and will call if positive or mail general letter not listing individual labs if negative.

## 2012-08-24 NOTE — Assessment & Plan Note (Addendum)
Pt has fasted today and would like fasting lipid panel. - Ordered, will call pt if abnormal and consider statin.

## 2012-08-24 NOTE — Progress Notes (Signed)
Patient ID: Helen Stone, female   DOB: 09-20-1975, 37 y.o.   MRN: [id number] Subjective:   CC: Wanting STD screen and cholesterol tested  HPI:   1. Wanting STD screen - Pt thinks she has lost some weight and is concerned about HIV. States she generally gets this tested annually and last visit forgot to have it done. She wants HIV and RPR, and does not want GC/Chlamydia since these were just done. Clothes are not fitting differently but she just is worried - objectively, showed her that from our weight measurements from the last few visits, she has not lost weight in the past few months. Denies fevers/chills.  2. Wanting cholesterol tested - Pt is obese and is requesting cholesterol testing today. She has fasted. She is not currently on a statin already.  3. Tobacco use - Pt wants to quit one day but does not feel ready to know. Smokes daily and has done so on and off since 6 years old. Her daughter would also like her to quit. She has tried nicotine gum before which did not work. Her job is stressful now and so she thinks it would be hard to stop now.   Review of Systems - Per HPI. Additionally, denies fevers, chills, chest pain, shortness of breath, abdominal pain, nausea, vomiting, diarrhea, rash, abnormal vaginal discharge, dyspareunia, or other complaints.  SH - Smoke cigarettes 8 / day off and on since 17 - Job is stressful  Objective:  Physical Exam BP 110/80  Pulse 80  Ht 5\' 7"  (1.702 m)  Wt 227 lb 9.6 oz (103.239 kg)  BMI 35.64 kg/m2  LMP 08/10/2012 GEN: NAD, pleasant, well-dressed HEENT: Atraumatic, normocephalic, neck supple, EOMI, sclera clear  CV: RRR, no murmurs, rubs, or gallops PULM: Normal effort SKIN: No rash or cyanosis; warm and well-perfused PSYCH: Mood and affect euthymic, normal rate and volume of speech NEURO: Awake, alert, no focal deficits grossly, normal speech    Assessment:     Helen Stone is a 37 y.o. female with h/o obesity here for STD testing and  cholesterol testing.    Plan:     # See problem list for problem-specific plans.

## 2012-08-25 ENCOUNTER — Encounter: Payer: Self-pay | Admitting: Family Medicine

## 2012-08-25 NOTE — Assessment & Plan Note (Signed)
Pt has tried a few times and failed, and is currently contemplative but not ready.  - Quitline information provided - Pt knows to contact me when ready and if she needs support - Encouraged her that this is the best thing she can do for her health.

## 2012-10-25 ENCOUNTER — Ambulatory Visit (INDEPENDENT_AMBULATORY_CARE_PROVIDER_SITE_OTHER): Payer: BC Managed Care – PPO | Admitting: Family Medicine

## 2012-10-25 ENCOUNTER — Encounter: Payer: Self-pay | Admitting: Family Medicine

## 2012-10-25 VITALS — BP 108/76 | HR 63 | Temp 99.4°F | Ht 67.0 in | Wt 222.2 lb

## 2012-10-25 DIAGNOSIS — A630 Anogenital (venereal) warts: Secondary | ICD-10-CM

## 2012-10-25 NOTE — Progress Notes (Signed)
  PROCEDURE NOTE: Shave removal of anal wart Procedure was supervised by Dr. Armen Pickup at the [facility].   Patient given informed consent, signed copy in the chart. Appropriate time out taken.  Area was prepped in the usual sterile fashion with povidone-iodine. 5cc lidocaine 2% with epinephrine was injected at the base of the wart on the right gluteus. After appropriate anesthesia was demonstrated a razor blade was used to shave the wart with its base off the underlying skin leaving a 1.5cm x 1.5cm wound where pressure was applied, steri-strips and gauze secured with tape. Specimen was sent for analysis.  The patient tolerated the procedure well. Minimal bleeding with adequate hemostasis achieved. Patient given post-procedure instructions as well as wound care supplies, and all questions were answered.   I will notify her of pathology results.  Hellon Vaccarella B. Jarvis Newcomer, MD, PGY-1 10/26/2012 8:53 AM

## 2012-10-25 NOTE — Assessment & Plan Note (Signed)
Removed anal wart today by shave biopsy (see procedure note), specimen sent. Will update pt with results. Recent STD screen performed and was negative.

## 2012-10-25 NOTE — Progress Notes (Signed)
Patient ID: Helen Stone, female   DOB: [birth year], 37 y.o.   MRN: [id number] Subjective:  HPI:   Helen Stone is a 37 y.o. female with h/o condyloma acuminatum here for wart removal.   She complains of symptoms including irritation and discomfort for the past few years. Symptoms are now "bad enough" to request removal. Wart is located near the anus and has grown in the past year. She reports no other lesions. She has had recent STD studies done and were negative. Remains in monogamous relationship since that time.   Review of Systems:  Per HPI. All other systems reviewed and are negative.    Objective:  Physical Exam: BP 108/76  Pulse 63  Temp(Src) 99.4 F (37.4 C) (Oral)  Ht 5\' 7"  (1.702 m)  Wt 222 lb 3.2 oz (100.789 kg)  BMI 34.79 kg/m2  Gen: 37 y.o. female in NAD HEENT: MMM, EOMI, PERRL CV: RRR, no MRG Resp: Non-labored, CTAB, no wheezes noted GI: Soft, NTND, BS present, no guarding or organomegaly Skin: condyloma present on R side near the anus, approximately 1.5 cm x 1.5 cm base, projecting about an inch above the skin. No signs of infection or bleeding. No other lesions were found by the patient or examiner.  MSK: No edema noted, full ROM Neuro: Alert and oriented, CN II-XII grossly intact, speech normal    Assessment:     Helen Stone is a 37 y.o. female with h/o condyloma acuminatum here for wart removal.     Plan:     See problem list for problem-specific plans.

## 2012-10-26 ENCOUNTER — Encounter: Payer: Self-pay | Admitting: Family Medicine

## 2012-10-26 NOTE — Patient Instructions (Signed)
Thank you for coming in today!  Apply the dressings we supplied to keep the area clean and dry. Don't take any baths for a week. You can shower tomorrow. You can use ibuprofen for pain.   Call if you experience uncontrollable bleeding, severe pain, fever, or if the wound is not gone in a week.   Please feel free to call with any questions or concerns at any time, at [phone]. - Dr. Jarvis Newcomer

## 2012-12-02 ENCOUNTER — Other Ambulatory Visit: Payer: Self-pay

## 2013-01-07 ENCOUNTER — Ambulatory Visit (INDEPENDENT_AMBULATORY_CARE_PROVIDER_SITE_OTHER): Payer: BC Managed Care – PPO | Admitting: Family Medicine

## 2013-01-07 ENCOUNTER — Encounter: Payer: Self-pay | Admitting: Family Medicine

## 2013-01-07 VITALS — BP 126/75 | HR 89 | Temp 97.6°F | Ht 67.0 in | Wt 229.4 lb

## 2013-01-07 DIAGNOSIS — Z113 Encounter for screening for infections with a predominantly sexual mode of transmission: Secondary | ICD-10-CM

## 2013-01-07 DIAGNOSIS — E663 Overweight: Secondary | ICD-10-CM

## 2013-01-07 DIAGNOSIS — F4521 Hypochondriasis: Secondary | ICD-10-CM

## 2013-01-07 LAB — HIV ANTIBODY (ROUTINE TESTING W REFLEX): HIV: NONREACTIVE

## 2013-01-07 LAB — RPR

## 2013-01-07 NOTE — Progress Notes (Signed)
Patient ID: Helen Stone, female   DOB: 23-Mar-1975, 37 y.o.   MRN: [id number] Subjective:  HPI:   Helen Stone is a 37 y.o. female with a history of condyloma acuminatum here for perceived weight loss.   Pt reports subjective weight loss for the past 3-4 months and feeling like she looks slimmer. She denies changes in clothing size or fit. She has been seen here for this before in July and requested HIV test at that time which was negative. 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 She reports being told by people there that she must be on drugs or must have AIDS to be losing weight. She has had 1 new sexual partner since September and reports occasional unprotected sex with him, and does not know his STI status.   Upon being told that she has, in fact, gained weight sing September, and has remained essentially the same weight throughout this year, she becomes quiet and reflective and continues to perseverate on the fact that she feels like she has lost weight.   Review of Systems:  Per HPI. Additionally: Negative fever, chills, night sweats, HA, vision changes, dysphagia, N/V/D/C, CP, SOB, dysuria, vaginal discharge, irregular vaginal bleeding, dysuria, frequency, myalgias.    Last pap smear (05/2011): Negative  Still smoking cigarettes, exacerbated by stress at work.     Objective:  Physical Exam: BP 126/75  Pulse 89  Temp(Src) 97.6 F (36.4 C) (Oral)  Ht 5\' 7"  (1.702 m)  Wt 229 lb 6.4 oz (104.055 kg)  BMI 35.92 kg/m2 Weights this year: 233 > 228 > 225 > 227 > 222 > 229  Gen: 37 y.o. female in NAD HEENT: MMM, EOMI, PERRL, no thrush, anicteric sclerae Neck: No lymphadenopathy CV: RRR, no MRG Resp: Non-labored, CTAB, no wheezes noted Abd: Soft, NTND, BS present, no guarding or organomegaly, no suprapubic tenderness MSK: No edema noted, full ROM Neuro: Alert and oriented, speech normal Psych: Worried  mood with congruent affect. + Persecutory thinking. Insight poor. Perseverating. No flight of ideas or pressured speech. No hallucinations, SI, or HI.     Assessment:     Helen Stone is a 37 y.o. 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     Plan:     See problem list for problem-specific plans.

## 2013-01-07 NOTE — Patient Instructions (Signed)
Thank you for coming in today!  We are checking some labs today, and I will call you if they are abnormal. If they are normal you will get a letter saying that everything was fine. If you continue feeling this way, please let me know so we can refer you to someone you can talk to about this.   As you leave, make an appointment to follow up with me in 6 months.   Take care and seek immediate care sooner than 6 months if you develop any concerns.  Please feel free to call with any questions or concerns at any time, at [phone]. - Dr. Jarvis Newcomer

## 2013-01-07 NOTE — Assessment & Plan Note (Signed)
Perseverating on "weight loss," even after being shown that she has actually gained weight, not changing clothing sizes, and having no symptoms of malignancy, infections. Has had this complaint before. Declines psychology referral at this time. Normal pap 05/2011, normal STI screening within previous year. Will recheck HIV/RPR per pt request, and because new partner and unprotected sex in past 3 months. No hallucinations, SI, HI.

## 2013-01-10 ENCOUNTER — Encounter: Payer: Self-pay | Admitting: Family Medicine

## 2013-01-10 ENCOUNTER — Telehealth: Payer: Self-pay | Admitting: *Deleted

## 2013-01-10 NOTE — Telephone Encounter (Signed)
Message copied by Tanna Savoy on Mon Jan 10, 2013 11:29 AM ------      Message from: Hazeline Junker B      Created: Mon Jan 10, 2013  9:26 AM       Hey, can you please send a letter that I just created for her? Thank you! ------

## 2013-01-10 NOTE — Telephone Encounter (Signed)
Letter was mailed. Porter Nakama, Virgel Bouquet

## 2013-04-19 ENCOUNTER — Encounter: Payer: Self-pay | Admitting: Family Medicine

## 2013-04-19 ENCOUNTER — Ambulatory Visit (INDEPENDENT_AMBULATORY_CARE_PROVIDER_SITE_OTHER): Payer: BC Managed Care – PPO | Admitting: Family Medicine

## 2013-04-19 VITALS — BP 116/81 | HR 74 | Temp 98.4°F | Ht 67.0 in | Wt 227.0 lb

## 2013-04-19 DIAGNOSIS — M25559 Pain in unspecified hip: Secondary | ICD-10-CM

## 2013-04-19 DIAGNOSIS — A630 Anogenital (venereal) warts: Secondary | ICD-10-CM

## 2013-04-19 DIAGNOSIS — M25552 Pain in left hip: Secondary | ICD-10-CM

## 2013-04-19 MED ORDER — FLUOROURACIL 5 % EX CREA: TOPICAL_CREAM | Freq: Two times a day (BID) | CUTANEOUS | Status: DC

## 2013-04-19 NOTE — Assessment & Plan Note (Addendum)
Offered excision, but pt prefers topical tx. Efudex 5% BID given. Pt educated on pregnancy precautions during tx. F/u 1 month to re-evaluate.

## 2013-04-19 NOTE — Patient Instructions (Signed)
Thank you for coming in today!  Apply the cream twice a day to the wart and return in 1 month to re-evaluate. You should not become pregnant while taking this medication.   As you leave, make an appointment to follow up with me in 1 month.   Take care and seek immediate care sooner if you develop any concerns.  Please feel free to call with any questions or concerns at any time, at [phone]. - Dr. Jarvis NewcomerGrunz

## 2013-04-19 NOTE — Progress Notes (Signed)
Patient ID: Helen Stone, female   DOB: 08/30/1975, 38 y.o.   MRN: [id number]   Subjective:  HPI:   Helen Stone is a 38 y.o. female with a history of genital warts here for evaluation of wart and L hip pain.   Helen Stone reports having L hip pain that presented suddenly, though she does not remember what she was doing at the time. It is mild, intermittent, and not worsening overall. Aleve relieves it and walking sometimes brings it on. She denies back pain, knee pain, wearing tight belts, or history of trauma. She wonders if her history of nephrectomy could have anything to do with this.   She also complains of a small but growing wart on her left inner leg that is becoming irritated as it rubs against the opposite leg with ambulation. She also dislikes the appearance. Denies any other lesions that she knows of. Had an anal wart surgically removed in Sept 2014, but does not want another procedure.  Review of Systems:  Per HPI. All other systems reviewed and are negative.    Past Medical History: Patient Active Problem List   Diagnosis Date Noted  . 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 CONDYLOMA ACUMINATUM 08/06/2009  . SOLITARY KIDNEY 06/08/2009  . TOBACCO USER 01/29/2009  . ANEMIA, MILD 12/18/2008  . RENAL CALCULUS, RECURRENT 11/15/2008  . DEPRESSION, CHRONIC 11/07/2008  . OVERWEIGHT 03/26/2006  . PAPANICOLAOU SMEAR, ABNORMAL 03/26/2006    Medications: reviewed and updated Current Outpatient Prescriptions  Medication Sig Dispense Refill  . fluorouracil (EFUDEX) 5 % cream Apply topically 2 (two) times daily.  40 g  0   No current facility-administered medications for this visit.    Objective:  Physical Exam: BP 116/81  Pulse 74  Temp(Src) 98.4 F (36.9 C) (Oral)  Ht 5\' 7"  (1.702 m)  Wt 227 lb (102.967 kg)  BMI 35.55 kg/m2  Gen: WDWN 38 y.o. female in NAD HEENT: MMM, EOMI, PERRL, anicteric sclerae CV: RRR, no MRG, no JVD Resp: Non-labored, CTAB, no wheezes noted Abd: Soft,  NTND, BS present, no guarding or organomegaly, no CVA tenderness MSK: No edema noted, full ROM, no pain on palpation of L hip, L thigh, SI joints.  Neuro: Alert and oriented, speech normal Skin: 1cm x 1cm rounded verrucous wart on medial L thigh near introitus. No other lesions noted on external genitalia.  Assessment:     Helen Stone is a 38 y.o. female here for genital warts.     Plan:     See problem list for problem-specific plans.

## 2013-04-19 NOTE — Assessment & Plan Note (Signed)
No worrying signs. Reassured and recommended tylenol or aleve prn. Reviewed exercises for strength and flexibility.

## 2013-04-22 ENCOUNTER — Other Ambulatory Visit: Payer: Self-pay | Admitting: Family Medicine

## 2013-04-22 DIAGNOSIS — A630 Anogenital (venereal) warts: Secondary | ICD-10-CM

## 2013-04-22 MED ORDER — IMIQUIMOD 5 % EX CREA: TOPICAL_CREAM | CUTANEOUS | Status: DC

## 2013-04-22 NOTE — Assessment & Plan Note (Signed)
Efudex costs >$300, so pt did not fill it. Imiquimod 5% cream prescribed thrice weekly x 16 weeks.

## 2013-05-17 ENCOUNTER — Encounter: Payer: Self-pay | Admitting: Family Medicine

## 2013-05-17 ENCOUNTER — Ambulatory Visit (INDEPENDENT_AMBULATORY_CARE_PROVIDER_SITE_OTHER): Payer: BC Managed Care – PPO | Admitting: Family Medicine

## 2013-05-17 VITALS — BP 115/76 | HR 78 | Temp 98.7°F | Ht 67.0 in | Wt 231.0 lb

## 2013-05-17 DIAGNOSIS — R51 Headache: Secondary | ICD-10-CM

## 2013-05-17 DIAGNOSIS — F4521 Hypochondriasis: Secondary | ICD-10-CM

## 2013-05-17 LAB — CBC WITH DIFFERENTIAL/PLATELET
BASOS ABS: 0 10*3/uL (ref 0.0–0.1)
Basophils Relative: 0 % (ref 0–1)
EOS ABS: 0.2 10*3/uL (ref 0.0–0.7)
Eosinophils Relative: 2 % (ref 0–5)
HCT: 37.8 % (ref 36.0–46.0)
Hemoglobin: 12.8 g/dL (ref 12.0–15.0)
LYMPHS ABS: 3.4 10*3/uL (ref 0.7–4.0)
LYMPHS PCT: 31 % (ref 12–46)
MCH: 28.3 pg (ref 26.0–34.0)
MCHC: 33.9 g/dL (ref 30.0–36.0)
MCV: 83.4 fL (ref 78.0–100.0)
Monocytes Absolute: 0.8 10*3/uL (ref 0.1–1.0)
Monocytes Relative: 7 % (ref 3–12)
NEUTROS PCT: 60 % (ref 43–77)
Neutro Abs: 6.7 10*3/uL (ref 1.7–7.7)
PLATELETS: 175 10*3/uL (ref 150–400)
RBC: 4.53 MIL/uL (ref 3.87–5.11)
RDW: 14.1 % (ref 11.5–15.5)
WBC: 11.1 10*3/uL — AB (ref 4.0–10.5)

## 2013-05-17 LAB — COMPREHENSIVE METABOLIC PANEL
ALBUMIN: 4 g/dL (ref 3.5–5.2)
ALT: 8 U/L (ref 0–35)
AST: 11 U/L (ref 0–37)
Alkaline Phosphatase: 49 U/L (ref 39–117)
BUN: 13 mg/dL (ref 6–23)
CALCIUM: 9.1 mg/dL (ref 8.4–10.5)
CHLORIDE: 107 meq/L (ref 96–112)
CO2: 28 meq/L (ref 19–32)
Creat: 0.92 mg/dL (ref 0.50–1.10)
GLUCOSE: 96 mg/dL (ref 70–99)
POTASSIUM: 4.3 meq/L (ref 3.5–5.3)
SODIUM: 138 meq/L (ref 135–145)
TOTAL PROTEIN: 7 g/dL (ref 6.0–8.3)
Total Bilirubin: 0.4 mg/dL (ref 0.2–1.2)

## 2013-05-17 NOTE — Patient Instructions (Signed)
Thank you for coming in,   I will call you with the lab results from today.    I think that your headache is associated with stress. I don't think that any other medication will improve with your pain.  I recommend that you meet with our psychologist, Dr. Spero GeraldsMichelle Kane, for help dealing with your stress.  You can schedule an appointment with her by calling her directly at [phone].  Please followup with Dr. Jarvis NewcomerGrunz in one week. I think regular followup will help in terms of talking about your stress.    Please feel free to call with any questions or concerns at any time, at [phone]. --Dr. Jordan LikesSchmitz  STRATEGIES THAT CAN HELP DEAL WITH STRESS:  Delegating or sharing responsibilities.  Avoiding confrontations.  Learning to be more assertive.  Regular exercise.  Avoid using alcohol or street drugs to cope.  Eating a healthy, balanced diet, rich in fruit and vegetables and proteins.  Finding humor or absurdity in stressful situations.  Never taking on more than you know you can handle comfortably.  Organizing your time better to get as much done as possible.  Talking to friends or family and sharing your thoughts and fears.  Listening to music or relaxation tapes.  Tensing and then relaxing your muscles, starting at the toes and working up to the head and neck.

## 2013-05-17 NOTE — Assessment & Plan Note (Addendum)
Patient with similar complaints per review of chart. Again focusing on so-called "weight loss," but gained weight since last appointment. Still focusing on something has to be wrong with her. Requesting a screening to prove to her coworkers that she is not using drugs even though she says she is not using drugs. Physical exam is reassuring with no focal deficits, no rashes, no findings on abdominal exam and appropriate muscle strength. Feel that the stress is causing her headache. Most likely will need close followup and continued reassurance as she doesn't seem to be accepting of all of her normal findings on exam. May have a component of depression or anxiety associated with it. - did not find the last PHQ-9 or GAD-7, will need completed at f/u.  - Provided ways of coping with stress  - Will continue to take Aleve as needed for headache - Discussed with Dr. Randolm IdolFletke  - Agreeable to refer to Dr. Pascal LuxKane - Follow up with Dr. Jarvis NewcomerGrunz in one week

## 2013-05-17 NOTE — Progress Notes (Signed)
   Subjective:    Patient ID: Helen Stone, female    DOB: 10/01/1975, 38 y.o.   MRN: [id number]  HPI  Mildreth L Sharee Stone is here for headache.  She started having headaches 2 weeks ago. The headache is in a global distribution. She does express some photosensitivity but denies aura. Her headache has been so bad that she is called in and left work early today. She has no history of migraines or family history of migraines. The pain is sharp and achy in nature. It is intermittent. It starts out of 5/10 but gradually increases to a 9/10. She takes 2 Aleve per day but this is not helping anymore. She denies fever, nausea, emesis, but does endorse lightheadedness. Noise makes it worse and nothing seems to help. She doesn't endorse increased thirst and does have a family history of diabetes.  Upon further questioning she started to say that she has immense stress associated were for work. She states that her stress is associated with people at work thinking that she is on drugs and the thought that she might lose her job.  Upon asking why she thinks she might lose her job, she thinks it may be because of her absence at work due to associated with her headaches. She has not tried anything to relieve her stress. Her stress is compounded by her description below.  Upon review of her chart, patient has a history of hypochondriasis and seems to focus on something being wrong with her. She has been seen before stating a history of weight loss but again today she has gained weight. Upon telling her that she is gaining weight, she still thinks that something is wrong with her. She doesn't have a specific reason as to why she thinks this. She has never had counseling for any of this before. She is open to counseling. She is expressing the need for any form of medication.  Current Outpatient Prescriptions on File Prior to Visit  Medication Sig Dispense Refill  . imiquimod (ALDARA) 5 % cream Apply topically 3 (three) times  a week.  24 each  0   No current facility-administered medications on file prior to visit.   Review of Systems See history of present illness    Objective:   Physical Exam BP 115/76  Pulse 78  Temp(Src) 98.7 F (37.1 C) (Oral)  Ht 5\' 7"  (1.702 m)  Wt 231 lb (104.781 kg)  BMI 36.17 kg/m2 Gen: NAD, alert, cooperative with exam, African [place] female, obese  HEENT: NCAT, PERRL, clear conjunctiva, supple neck, no LAD CV: RRR, good S1/S2, no murmur, no edema, capillary refill brisk  Resp: CTABL, no wheezes, non-labored Abd: SNTND, BS present, no guarding or organomegaly Skin: no rashes, normal turgor  Neuro: CN 2-12 intact, 5 out of 5 muscle strength in upper and lower extremities bilaterally, normal sensation in upper and lower extremities bilaterally, Psych: poor insight, oriented        Assessment & Plan:

## 2013-05-19 ENCOUNTER — Telehealth: Payer: Self-pay | Admitting: *Deleted

## 2013-05-19 NOTE — Telephone Encounter (Signed)
Spoke with patient and informed her of below 

## 2013-05-19 NOTE — Telephone Encounter (Signed)
Message copied by Farrell OursEVANS, Keval Nam K on Thu May 19, 2013 11:46 AM ------      Message from: Clare GandySCHMITZ, JEREMY E      Created: Thu May 19, 2013 11:23 AM       Please inform patient that her labs were normal. If she has any further questions then she should follow up with her PCP Dr. Jarvis NewcomerGrunz. Thank you. ------

## 2013-07-26 ENCOUNTER — Ambulatory Visit (INDEPENDENT_AMBULATORY_CARE_PROVIDER_SITE_OTHER): Payer: BC Managed Care – PPO | Admitting: Family Medicine

## 2013-07-26 VITALS — BP 107/59 | HR 82 | Temp 99.1°F | Ht 67.0 in | Wt 223.0 lb

## 2013-07-26 DIAGNOSIS — Z01419 Encounter for gynecological examination (general) (routine) without abnormal findings: Secondary | ICD-10-CM

## 2013-07-26 DIAGNOSIS — Z202 Contact with and (suspected) exposure to infections with a predominantly sexual mode of transmission: Secondary | ICD-10-CM

## 2013-07-26 LAB — CBC WITH DIFFERENTIAL/PLATELET
BASOS ABS: 0 10*3/uL (ref 0.0–0.1)
Basophils Relative: 0 % (ref 0–1)
EOS ABS: 0.1 10*3/uL (ref 0.0–0.7)
EOS PCT: 1 % (ref 0–5)
HCT: 37.8 % (ref 36.0–46.0)
Hemoglobin: 12.9 g/dL (ref 12.0–15.0)
LYMPHS ABS: 3.7 10*3/uL (ref 0.7–4.0)
Lymphocytes Relative: 36 % (ref 12–46)
MCH: 28.7 pg (ref 26.0–34.0)
MCHC: 34.1 g/dL (ref 30.0–36.0)
MCV: 84.2 fL (ref 78.0–100.0)
Monocytes Absolute: 0.7 10*3/uL (ref 0.1–1.0)
Monocytes Relative: 7 % (ref 3–12)
NEUTROS PCT: 56 % (ref 43–77)
Neutro Abs: 5.8 10*3/uL (ref 1.7–7.7)
PLATELETS: 216 10*3/uL (ref 150–400)
RBC: 4.49 MIL/uL (ref 3.87–5.11)
RDW: 14.1 % (ref 11.5–15.5)
WBC: 10.3 10*3/uL (ref 4.0–10.5)

## 2013-07-26 NOTE — Patient Instructions (Signed)
Thank you for coming in today!  We are checking some labs today, and I will call you if they are abnormal.  As you leave, make an appointment to follow up with me for pap smear.   Take care and seek immediate care sooner if you develop any concerns.  Please feel free to call with any questions or concerns at any time, at [phone]. - Dr. Jarvis NewcomerGrunz

## 2013-07-26 NOTE — Progress Notes (Signed)
  Subjective:     Helen Stone is a 38 y.o. female and is here for a comprehensive physical exam. The patient is concerned that she continues to lose weight and would like an HIV test.   She says "You guys keep telling me there's nothing wrong but I just know I've lost too much weight."  She has lost 7 lbs since her last visit, but has otherwise been near the same value.  Both of her parents are HIV positive, though her father is incarcerated and she does not know him and her mother is also not in communication with her. She reports vaginal intercourse with one female partner usually without protection. She states that he is the only one she is having sex with, but she doesn't think sshe's the only person he's having sex with. The deciding factor in using protection is how he feels. She does not desire pregnancy and is not interested in discussing alternative birth control options.   Denies fevers, chills, night sweats, rash, fatigue.   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  She reports smoking about 1/2 ppd and does not desire to quit smoking. She also endorses smoking marijuana but no other drugs and no alcohol.    Health Maintenance  Topic Date Due  . Tetanus/tdap  11/30/2012  . Influenza Vaccine  08/27/2013  . Pap Smear  05/29/2014   The following portions of the patient's history were reviewed and updated as appropriate: allergies, current medications, past family history, past medical history, past social history, past surgical history and problem list.  Review of Systems A comprehensive review of systems was negative.   Objective:    BP 107/59  Pulse 82  Temp(Src) 99.1 F (37.3 C) (Oral)  Ht 5\' 7"  (1.702 m)  Wt 223 lb (101.152 kg)  BMI 34.92 kg/m2  LMP 07/26/2013 General appearance: alert, cooperative and appears stated age Head: Normocephalic, without obvious abnormality, atraumatic Eyes: conjunctivae/corneas  clear. PERRL, EOM's intact. Fundi benign. Ears: normal TM's and external ear canals both ears Nose: Nares normal. Septum midline. Mucosa normal. No drainage or sinus tenderness. Throat: lips, mucosa, and tongue normal; teeth and gums normal Neck: no adenopathy, no carotid bruit, no JVD, supple, symmetrical, trachea midline and thyroid not enlarged, symmetric, no tenderness/mass/nodules Back: symmetric, no curvature. ROM normal. No CVA tenderness. Lungs: clear to auscultation bilaterally Heart: regular rate and rhythm, S1, S2 normal, no murmur, click, rub or gallop Abdomen: soft, non-tender; bowel sounds normal; no masses,  no organomegaly Extremities: extremities normal, atraumatic, no cyanosis or edema Skin: Skin color, texture, turgor normal. No rashes or lesions Lymph nodes: Cervical, supraclavicular, and axillary nodes normal. 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     Plan:     - HIV Ab w/reflex, RPR, GC/Chl DNA probe due to potential STI exposure. Discussed how results would be reported - only to her and only by phone.    - CBC, BMP See After Visit Summary for Counseling Recommendations

## 2013-07-27 LAB — BASIC METABOLIC PANEL
BUN: 13 mg/dL (ref 6–23)
CALCIUM: 9.2 mg/dL (ref 8.4–10.5)
CO2: 25 meq/L (ref 19–32)
CREATININE: 0.85 mg/dL (ref 0.50–1.10)
Chloride: 107 mEq/L (ref 96–112)
Glucose, Bld: 87 mg/dL (ref 70–99)
Potassium: 4.2 mEq/L (ref 3.5–5.3)
SODIUM: 140 meq/L (ref 135–145)

## 2013-07-27 LAB — RPR

## 2013-07-27 LAB — HIV ANTIBODY (ROUTINE TESTING W REFLEX): HIV: NONREACTIVE

## 2013-07-28 ENCOUNTER — Encounter: Payer: Self-pay | Admitting: Family Medicine

## 2013-08-15 ENCOUNTER — Encounter: Payer: Self-pay | Admitting: Family Medicine

## 2013-08-15 ENCOUNTER — Other Ambulatory Visit (HOSPITAL_COMMUNITY)
Admission: RE | Admit: 2013-08-15 | Discharge: 2013-08-15 | Disposition: A | Discharge status: Home / Self Care | Payer: BC Managed Care – PPO | Source: Ambulatory Visit | Attending: Family Medicine | Admitting: Family Medicine

## 2013-08-15 ENCOUNTER — Ambulatory Visit (INDEPENDENT_AMBULATORY_CARE_PROVIDER_SITE_OTHER): Payer: BC Managed Care – PPO | Admitting: Family Medicine

## 2013-08-15 VITALS — BP 125/79 | HR 79 | Temp 98.1°F | Ht 67.0 in | Wt 228.0 lb

## 2013-08-15 DIAGNOSIS — Z124 Encounter for screening for malignant neoplasm of cervix: Secondary | ICD-10-CM | POA: Insufficient documentation

## 2013-08-15 DIAGNOSIS — Z1151 Encounter for screening for human papillomavirus (HPV): Secondary | ICD-10-CM | POA: Insufficient documentation

## 2013-08-15 NOTE — Progress Notes (Signed)
  Subjective:     Helen Stone is a 38 y.o. woman who comes in today for a  pap smear only. Her most recent annual exam was on 07/26/2013. Her most recent Pap smear was on May 2013 and showed no abnormalities. Previous abnormal Pap smears: yes - she does not recall and it is not documented in EMR. Contraception: condoms occasionally, though is willing to become pregnant.   The following portions of the patient's history were reviewed and updated as appropriate: allergies, current medications, past family history, past medical history, past social history, past surgical history and problem list.  Review of Systems A comprehensive review of systems was negative.   Objective:    BP 125/79  Pulse 79  Temp(Src) 98.1 F (36.7 C) (Oral)  Ht 5\' 7"  (1.702 m)  Wt 228 lb (103.42 kg)  BMI 35.70 kg/m2  LMP 07/26/2013 Pelvic Exam: cervix normal in appearance, external genitalia normal and vagina normal without discharge. Pap smear obtained.   Assessment:    Screening pap smear.   Plan:    Follow up as indicated by Pap results. Results to be sent by letter if negative, by call if positive.

## 2013-08-15 NOTE — Patient Instructions (Signed)
-   You will be contacted with the results of this pap smear.   Take care!  - Dr. Jarvis NewcomerGrunz

## 2013-08-17 LAB — CYTOLOGY - PAP

## 2013-08-18 ENCOUNTER — Encounter: Payer: Self-pay | Admitting: Family Medicine

## 2013-08-31 ENCOUNTER — Encounter: Payer: Self-pay | Admitting: Family Medicine

## 2013-08-31 ENCOUNTER — Telehealth: Payer: Self-pay | Admitting: Family Medicine

## 2013-08-31 NOTE — Telephone Encounter (Signed)
LVM for patient to call back. Wanting

## 2013-08-31 NOTE — Telephone Encounter (Signed)
Please print out lab rsults and physical info for patient.  Call when ready for pickup

## 2013-09-01 NOTE — Telephone Encounter (Signed)
Patient came by and picked up a lab report and a copy of her chart physical. Patient did sign a ROI due to the information that is on the record

## 2014-08-02 ENCOUNTER — Ambulatory Visit (INDEPENDENT_AMBULATORY_CARE_PROVIDER_SITE_OTHER): Payer: BLUE CROSS/BLUE SHIELD | Admitting: Family Medicine

## 2014-08-02 ENCOUNTER — Encounter: Payer: Self-pay | Admitting: Family Medicine

## 2014-08-02 VITALS — BP 114/82 | HR 76 | Temp 98.3°F

## 2014-08-02 DIAGNOSIS — K5900 Constipation, unspecified: Secondary | ICD-10-CM | POA: Diagnosis not present

## 2014-08-02 LAB — HEMOCCULT GUIAC POC 1CARD (OFFICE): Fecal Occult Blood, POC: NEGATIVE

## 2014-08-02 MED ORDER — POLYETHYLENE GLYCOL 3350 17 G PO PACK: 17.0000 g | PACK | Freq: Every day | ORAL | Status: DC

## 2014-08-02 NOTE — Assessment & Plan Note (Signed)
Most likely normal transit constipation. No findings on exam. Stool card negative - We will supplement fiber one cap full daily. She was instructed on increasing it every 3 days if no achieved bowel movement. - If she increases and is at 2 capsules with no achieved bowel movement then follow-up with PCP for addition of other pharmacological agents and/or imaging

## 2014-08-02 NOTE — Progress Notes (Signed)
   Subjective:    Patient ID: Helen Stone, female    DOB: 05/30/1975, 39 y.o.   MRN: [id number]  Seen for Same day visit for   CC: constipation   PMH: Nephrolithiasis, nephrectomy left-sided 2010, colostomy and revision 2011  Patient has a history of severe for lithiasis which led to severe pyelonephritis. 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 She had a renal anastomosis in May 2011.  The day she presents with constipation. History for the past 2 months but starting to have stomach pain for the past week. She has some constipation symptoms after her colostomy but those have resolved. She has a sensation of needing to defecate but is unable to produce a movement. She may produce small hard stools but no relief of the needing the urge to have a movement. She has a normal appetite. She has had flatus. She has not had any sexual encounters for the past 6 months and denies being pregnant. She does not take any medicines. She is a laxity for 1 day and that seemed to improve her symptoms. She denies any fever, chills, night sweats, dysuria, diarrhea, nausea, or vomiting.  Review of Systems   See HPI for ROS. Objective:  BP 114/82 mmHg  Pulse 76  Temp(Src) 98.3 F (36.8 C) (Oral)  General: NAD Abdomen: soft, nontender, nondistended, no hepatic or splenomegaly. Bowel sounds present Rectum: condyloma present on ride side near anus. No external or internal hemorrhoids. Normal rectal tone  Skin: warm and dry, no rashes noted Neuro: alert and oriented, no focal deficits     Assessment & Plan:  See Problem List Documentation

## 2014-08-02 NOTE — Patient Instructions (Signed)
Thank you for coming in,   Take the fiber and have a dedicated amount of time that is not pressured when you have to take a movement.   We will start with one scoop and increase as needed.   Follow up with Dr. Jarvis NewcomerGrunz if these symptoms persists for the next 2-3 weeks.   Please bring all of your medications with you to each visit.    Please feel free to call with any questions or concerns at any time, at [phone]. --Dr. Jordan LikesSchmitz

## 2015-02-21 ENCOUNTER — Ambulatory Visit (INDEPENDENT_AMBULATORY_CARE_PROVIDER_SITE_OTHER): Payer: Medicaid Other | Admitting: Family Medicine

## 2015-02-21 ENCOUNTER — Encounter: Payer: Self-pay | Admitting: Family Medicine

## 2015-02-21 VITALS — BP 125/77 | HR 69 | Temp 98.1°F | Ht 67.0 in | Wt 263.3 lb

## 2015-02-21 DIAGNOSIS — N907 Vulvar cyst: Secondary | ICD-10-CM | POA: Diagnosis not present

## 2015-02-21 DIAGNOSIS — A630 Anogenital (venereal) warts: Secondary | ICD-10-CM | POA: Diagnosis present

## 2015-02-21 DIAGNOSIS — Z23 Encounter for immunization: Secondary | ICD-10-CM

## 2015-02-21 MED ORDER — IMIQUIMOD 5 % EX CREA: TOPICAL_CREAM | CUTANEOUS | Status: DC

## 2015-02-21 NOTE — Patient Instructions (Signed)
Human Papillomavirus Human papillomavirus (HPV) is the most common sexually transmitted infection (STI) and is highly contagious. HPV infections cause genital warts and cancers to the outlet of the womb (cervix), birth canal (vagina), opening of the birth canal (vulva), and anus. There are over 100 types of HPV. Unless wartlike lesions are present in the throat or there are genital warts that you can see or feel, HPV usually does not cause symptoms. It is possible to be infected for long periods and pass it on to others without knowing it. CAUSES  HPV is spread from person to person through sexual contact. This includes oral, vaginal, or anal sex. RISK FACTORS  Having unprotected sex. HPV can be spread by oral, vaginal, or anal sex.  Having several sex partners.  Having a sex partner who has other sex partners.  Having or having had another sexually transmitted infection. SIGNS AND SYMPTOMS  Most people carrying HPV do not have any symptoms. If symptoms are present, symptoms may include:  Wartlike lesions in the throat (from having oral sex).  Warts in the infected skin or mucous membranes.  Genital warts that may itch, burn, or bleed.  Genital warts that may be painful or bleed during sexual intercourse. DIAGNOSIS  If wartlike lesions are present in the throat or genital warts are present, your health care provider can usually diagnose HPV by physical examination.   Genital warts are easily seen with the naked eye.  Currently, there is no FDA-approved test to detect HPV in males.  In females, a Pap test can show cells that are infected with HPV.  In females, a scope can be used to view the cervix (colposcopy). A colposcopy can be performed if the pelvic exam or Pap test is abnormal. A sample of tissue may be removed (biopsy) during the colposcopy. TREATMENT  There is no treatment for the virus itself. However, there are treatments for the health problems and symptoms HPV can cause.  Your health care provider will follow you closely after you are treated. This is because the HPV can come back and may need treatment again. Treatment of HPV may include:   Medicines, which may be injected or applied in a cream, lotion, or gel form.  Use of a probe to apply extreme cold (cryotherapy).  Application of an intense beam of light (laser treatment).  Use of a probe to apply extreme heat (electrocautery).  Surgery. HOME CARE INSTRUCTIONS   Take medicines only as directed by your health care provider.  Use over-the-counter creams for itching or irritation as directed by your health care provider.  Keep all follow-up visits as directed by your health care provider. This is important.  Do not touch or scratch the warts.  Do not treat genital warts with medicines used for treating hand warts.  Do not have sex while you are being treated.  Do not douche or use tampons during treatment of HPV.  Tell your sex partner about your infection because he or she may also need treatment.  If you become pregnant, tell your health care provider that you have had HPV. Your health care provider will monitor you closely during pregnancy to be sure your baby is safe.  After treatment, use condoms during sex to prevent future infections.  Have only one sex partner.  Have a sex partner who does not have other sex partners. PREVENTION   Talk to your health care provider about getting the HPV vaccines. These vaccines prevent some HPV infections and cancers.   It is recommended that the vaccine be given to males and females between the ages of 9 and 26 years old. It will not work if you already have HPV, and it is not recommended for pregnant women.  A Pap test is done to screen for cervical cancer in women.  The first Pap test should be done at age 21 years.  Between ages 21 and 29 years, Pap tests are repeated every 2 years.  Beginning at age 30, you are advised to have a Pap test every  3 years as long as your past 3 Pap tests have been normal.  Some women have medical problems that increase the chance of getting cervical cancer. Talk to your health care provider about these problems. It is especially important to talk to your health care provider if a new problem develops soon after your last Pap test. In these cases, your health care provider may recommend more frequent screening and Pap tests.  The above recommendations are the same for women who have or have not gotten the vaccine for HPV.  If you had a hysterectomy for a problem that was not a cancer or a condition that could lead to cancer, then you no longer need Pap tests. However, even if you no longer need a Pap test, a regular exam is a good idea to make sure no other problems are starting.   If you are between the ages of 65 and 70 years and you have had normal Pap tests going back 10 years, you no longer need Pap tests. However, even if you no longer need a Pap test, a regular exam is a good idea to make sure no other problems are starting.  If you have had past treatment for cervical cancer or a condition that could lead to cancer, you need Pap tests and screening for cancer for at least 20 years after your treatment.  If Pap tests have been discontinued, risk factors (such as a new sexual partner)need to be reassessed to determine if screening should be resumed.  Some women may need screenings more often if they are at high risk for cervical cancer. SEEK MEDICAL CARE IF:   The treated skin becomes red, swollen, or painful.  You have a fever.  You feel generally ill.  You feel lumps or pimple-like projections in and around your genital area.  You develop bleeding of the vagina or the treatment area.  You have painful sexual intercourse. MAKE SURE YOU:   Understand these instructions.  Will watch your condition.  Will get help if you are not doing well or get worse.   This information is not  intended to replace advice given to you by your health care provider. Make sure you discuss any questions you have with your health care provider.   Document Released: 04/05/2003 Document Revised: 02/03/2014 Document Reviewed: 04/20/2013 Elsevier Interactive Patient Education 2016 [employer]  

## 2015-02-21 NOTE — Progress Notes (Signed)
    Subjective:    Patient ID: Helen Stone is a 40 y.o. female presenting with No chief complaint on file.  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 Has peri-rectal one and two on labia majora.  Review of Systems  Constitutional: Negative for fever and chills.  Respiratory: Negative for shortness of breath.   Cardiovascular: Negative for chest pain.  Gastrointestinal: Negative for nausea, vomiting and abdominal pain.  Genitourinary: Negative for dysuria.  Skin: Negative for rash.      Objective:   Physical Exam  Constitutional: She is oriented to person, place, and time. She appears well-developed and well-nourished. No distress.  HENT:  Head: Normocephalic and atraumatic.  Eyes: No scleral icterus.  Neck: Neck supple.  Cardiovascular: Normal rate.   Pulmonary/Chest: Effort normal.  Abdominal: Soft.  Genitourinary:  There are two small nodules in right vulva, one with a white head and appear to be inclusion cysts. There is one condyloma peri-rectally  Neurological: She is alert and oriented to person, place, and time.  Skin: Skin is warm and dry.  Psychiatric: She has a normal mood and affect.       Assessment & Plan:   Problem List Items Addressed This Visit      Unprioritized   Condyloma acuminatum - Primary (Chronic)    Trial of Aldara--may need TCA--if not working      Relevant Medications   imiquimod (ALDARA) 5 % cream    Other Visit Diagnoses    Encounter for immunization        Inclusion cyst of vulva        warm compresses to bring to head-may need to be excised        Return in about 4 weeks (around 03/21/2015).  Helen Stone 02/21/2015 3:46 PM

## 2015-02-21 NOTE — Assessment & Plan Note (Signed)
Trial of Aldara--may need TCA--if not working

## 2015-04-12 ENCOUNTER — Encounter (HOSPITAL_COMMUNITY): Payer: Self-pay | Admitting: *Deleted

## 2015-04-12 ENCOUNTER — Emergency Department (HOSPITAL_COMMUNITY)
Admission: EM | Admit: 2015-04-12 | Discharge: 2015-04-12 | Disposition: A | Discharge status: Home / Self Care | Payer: Medicaid Other | Source: Home / Self Care

## 2015-04-12 DIAGNOSIS — J111 Influenza due to unidentified influenza virus with other respiratory manifestations: Secondary | ICD-10-CM

## 2015-04-12 MED ORDER — IPRATROPIUM BROMIDE 0.06 % NA SOLN: 2.0000 | Freq: Four times a day (QID) | NASAL | Status: DC

## 2015-04-12 MED ORDER — DOXYCYCLINE HYCLATE 100 MG PO CAPS: 100.0000 mg | ORAL_CAPSULE | Freq: Two times a day (BID) | ORAL | Status: DC

## 2015-04-12 NOTE — ED Notes (Signed)
Pt   Reports      Symptoms  Of  Vomiting  /  Diarrhea   With  Onset      X  2  Days        Pt  Reports  A  Lack of  Energy       X  2  Days

## 2015-04-12 NOTE — Discharge Instructions (Signed)
Drink plenty of fluids as discussed, use medicine as prescribed, and mucinex or delsym for cough. No more smoking.  Return or see your doctor if further problems °

## 2015-04-12 NOTE — ED Provider Notes (Signed)
CSN: [id number]     Arrival date & time 04/12/15  1306 History   None    No chief complaint on file.  (Consider location/radiation/quality/duration/timing/severity/associated sxs/prior Treatment) Patient is a 40 y.o. female presenting with cough. The history is provided by the patient.  Cough Cough characteristics:  Non-productive, dry and hacking Severity:  Moderate Onset quality:  Gradual Duration:  2 days Progression:  Unchanged Chronicity:  New Smoker: yes   Context: sick contacts and upper respiratory infection   Associated symptoms: chills and rhinorrhea   Associated symptoms: no fever, no rash and no shortness of breath     Past Medical History  Diagnosis Date  . Solitary kidney   . Renal calculi   . Depression   . Pelvic abscess in female   . Dysplasia of cervix, low grade (CIN 1)     2007 colpo   Past Surgical History  Procedure Laterality Date  . Nephrectomy    . Colectomy  2010  . Colon surgery  2010    colostomy takedown  . Dilation and curettage of uterus      x2  . Cervix lesion destruction  2007   Family History  Problem Relation Age of Onset  . Hypertension Mother   . Diabetes Mother   . Diabetes Maternal Aunt   . Heart disease Maternal Aunt   . Hypertension Maternal Aunt   . HIV Father   . Heart disease Maternal Uncle   . Heart disease Maternal Grandmother   . Diabetes Maternal Grandmother    Social History  Substance Use Topics  . Smoking status: Current Every Day Smoker -- 0.50 packs/day    Types: Cigarettes  . Smokeless tobacco: Never Used  . Alcohol Use: No   OB History    No data available     Review of Systems  Constitutional: Positive for chills and appetite change. Negative for fever.  HENT: Positive for congestion, postnasal drip and rhinorrhea.   Respiratory: Positive for cough. Negative for shortness of breath.   Cardiovascular: Negative.   Gastrointestinal: Negative.   Genitourinary: Negative.   Skin: Negative for rash.   All other systems reviewed and are negative.   Allergies  Mirtazapine  Home Medications   Prior to Admission medications   Medication Sig Start Date End Date Taking? Authorizing Provider  imiquimod (ALDARA) 5 % cream Apply topically 3 (three) times a week. Apply at hs, wash off in am 02/21/15   Reva Boresanya S Pratt, MD  polyethylene glycol [facility](MIRALAX / Ethelene HalGLYCOLAX) packet Take 17 g by mouth daily. Patient not taking: Reported on 02/21/2015 08/02/14   Myra RudeJeremy E Schmitz, MD   Meds Ordered and Administered this Visit  Medications - No data to display  There were no vitals taken for this visit. No data found.   Physical Exam  Constitutional: She is oriented to person, place, and time. She appears well-developed and well-nourished. No distress.  HENT:  Right Ear: External ear normal.  Left Ear: External ear normal.  Mouth/Throat: Oropharynx is clear and moist.  Eyes: Pupils are equal, round, and reactive to light.  Neck: Normal range of motion. Neck supple.  Cardiovascular: Normal heart sounds and intact distal pulses.   Pulmonary/Chest: Effort normal. She has decreased breath sounds. She has rhonchi.  Abdominal: Soft. Bowel sounds are normal.  Lymphadenopathy:    She has no cervical adenopathy.  Neurological: She is alert and oriented to person, place, and time.  Skin: Skin is warm and dry.  Nursing note and  vitals reviewed.   ED Course  Procedures (including critical care time)  Labs Review Labs Reviewed - No data to display  Imaging Review No results found.   Visual Acuity Review  Right Eye Distance:   Left Eye Distance:   Bilateral Distance:    Right Eye Near:   Left Eye Near:    Bilateral Near:         MDM  No diagnosis found. Meds ordered this encounter  Medications  . ipratropium (ATROVENT) 0.06 % nasal spray    Sig: Place 2 sprays into both nostrils 4 (four) times daily.    Dispense:  15 mL    Refill:  1  . doxycycline (VIBRAMYCIN) 100 MG capsule    Sig:  Take 1 capsule (100 mg total) by mouth 2 (two) times daily.    Dispense:  20 capsule    Refill:  0      Linna Hoff, MD 04/12/15 1440

## 2015-05-21 ENCOUNTER — Ambulatory Visit (HOSPITAL_COMMUNITY)
Admission: EM | Admit: 2015-05-21 | Discharge: 2015-05-21 | Disposition: A | Discharge status: Home / Self Care | Payer: Medicaid Other

## 2015-12-11 ENCOUNTER — Ambulatory Visit (INDEPENDENT_AMBULATORY_CARE_PROVIDER_SITE_OTHER): Payer: Medicaid Other | Admitting: *Deleted

## 2015-12-11 DIAGNOSIS — Z23 Encounter for immunization: Secondary | ICD-10-CM | POA: Diagnosis present

## 2016-08-06 ENCOUNTER — Emergency Department (HOSPITAL_COMMUNITY): Payer: Medicaid Other

## 2016-08-06 ENCOUNTER — Emergency Department (HOSPITAL_COMMUNITY)
Admission: EM | Admit: 2016-08-06 | Discharge: 2016-08-06 | Disposition: A | Discharge status: Home / Self Care | Payer: Medicaid Other | Attending: Emergency Medicine | Admitting: Emergency Medicine

## 2016-08-06 ENCOUNTER — Encounter (HOSPITAL_COMMUNITY): Payer: Self-pay | Admitting: Emergency Medicine

## 2016-08-06 DIAGNOSIS — K5792 Diverticulitis of intestine, part unspecified, without perforation or abscess without bleeding: Secondary | ICD-10-CM

## 2016-08-06 DIAGNOSIS — J45909 Unspecified asthma, uncomplicated: Secondary | ICD-10-CM | POA: Insufficient documentation

## 2016-08-06 DIAGNOSIS — F17210 Nicotine dependence, cigarettes, uncomplicated: Secondary | ICD-10-CM | POA: Insufficient documentation

## 2016-08-06 HISTORY — DX: Unspecified asthma, uncomplicated: J45.909

## 2016-08-06 LAB — COMPREHENSIVE METABOLIC PANEL
ALK PHOS: 55 U/L (ref 38–126)
ALT: 12 U/L — ABNORMAL LOW (ref 14–54)
ANION GAP: 8 (ref 5–15)
AST: 14 U/L — ABNORMAL LOW (ref 15–41)
Albumin: 3.3 g/dL — ABNORMAL LOW (ref 3.5–5.0)
BILIRUBIN TOTAL: 0.7 mg/dL (ref 0.3–1.2)
BUN: 8 mg/dL (ref 6–20)
CALCIUM: 8.6 mg/dL — AB (ref 8.9–10.3)
CO2: 23 mmol/L (ref 22–32)
Chloride: 107 mmol/L (ref 101–111)
Creatinine, Ser: 0.81 mg/dL (ref 0.44–1.00)
GFR calc non Af Amer: >60 mL/min (ref >60)
Glucose, Bld: 95 mg/dL (ref 65–99)
POTASSIUM: 3.8 mmol/L (ref 3.5–5.1)
SODIUM: 138 mmol/L (ref 135–145)
TOTAL PROTEIN: 7.5 g/dL (ref 6.5–8.1)

## 2016-08-06 LAB — URINALYSIS, ROUTINE W REFLEX MICROSCOPIC
BILIRUBIN URINE: NEGATIVE
GLUCOSE, UA: NEGATIVE mg/dL
KETONES UR: NEGATIVE mg/dL
LEUKOCYTES UA: NEGATIVE
NITRITE: NEGATIVE
PROTEIN: 30 mg/dL — AB
Specific Gravity, Urine: 1.018 (ref 1.005–1.030)
pH: 5 (ref 5.0–8.0)

## 2016-08-06 LAB — I-STAT BETA HCG BLOOD, ED (MC, WL, AP ONLY): I-stat hCG, quantitative: <5 m[IU]/mL (ref <5)

## 2016-08-06 LAB — LIPASE, BLOOD: Lipase: 12 U/L (ref 11–51)

## 2016-08-06 LAB — CBC
HEMATOCRIT: 34 % — AB (ref 36.0–46.0)
HEMOGLOBIN: 11.3 g/dL — AB (ref 12.0–15.0)
MCH: 27 pg (ref 26.0–34.0)
MCHC: 33.2 g/dL (ref 30.0–36.0)
MCV: 81.3 fL (ref 78.0–100.0)
Platelets: 173 10*3/uL (ref 150–400)
RBC: 4.18 MIL/uL (ref 3.87–5.11)
RDW: 14.9 % (ref 11.5–15.5)
WBC: 11.1 10*3/uL — ABNORMAL HIGH (ref 4.0–10.5)

## 2016-08-06 MED ORDER — SODIUM CHLORIDE 0.9 % IV SOLN
Freq: Once | INTRAVENOUS | Status: AC
  Administered 2016-08-06: 100 mL/h via INTRAVENOUS

## 2016-08-06 MED ORDER — CIPROFLOXACIN HCL 500 MG PO TABS: 500.0000 mg | ORAL_TABLET | Freq: Two times a day (BID) | ORAL | 0 refills | Status: DC

## 2016-08-06 MED ORDER — METRONIDAZOLE 500 MG PO TABS
500.0000 mg | ORAL_TABLET | Freq: Three times a day (TID) | ORAL | 0 refills | Status: DC
Start: 2016-08-06 — End: 2017-06-11

## 2016-08-06 MED ORDER — IOPAMIDOL (ISOVUE-300) INJECTION 61%
100.0000 mL | Freq: Once | INTRAVENOUS | Status: AC | PRN
  Administered 2016-08-06: 100 mL via INTRAVENOUS

## 2016-08-06 MED ORDER — IOPAMIDOL (ISOVUE-300) INJECTION 61%
INTRAVENOUS | Status: DC
Start: 2016-08-06 — End: 2016-08-06
  Filled 2016-08-06: qty 100

## 2016-08-06 NOTE — ED Triage Notes (Signed)
Pt c/o intermittent sharp periumbilical and epigastric abdominal pain x several months with intermittent emesis and diarrhea. No blood noted in stool or emesis, but pt did not look. Hx of partial colectomy 7 years ago.

## 2016-08-06 NOTE — ED Provider Notes (Signed)
[facility]-EMERGENCY DEPT Provider Note   CSN: [id number] Arrival date & time: 08/06/16  0705     History   Chief Complaint Chief Complaint  Patient presents with  . Abdominal Pain    HPI Helen Stone is a 41 y.o. female.  The history is provided by the patient and medical records. No language interpreter was used.    Helen Stone is a 41 y.o. female  with a PMH of partial colon resection in 2010 who presents to the emergency Department complaining of upper abdominal pain which began 3 days ago. She initially thought this might have been due to food she ate, but symptoms have been gradually worsening over the last 2 days. She has had associated nausea and 2 episodes of vomiting area and she did have one loose nonbloody stool at onset Monday night. Thousand radicular symptoms. No sick contacts. No alleviating or aggravating factors noted. She has taken no medication prior to arrival for symptoms. She had a similar episode in March or April, but symptoms went away after a day, therefore she did not seek medical care. She is on her menstrual cycle. She has no vaginal discharge, urinary complaints, chest pain, back pain or shortness of breath. No fever or chills.  Past Medical History:  Diagnosis Date  . Asthma   . Depression   . Dysplasia of cervix, low grade (CIN 1)    2007 colpo  . Pelvic abscess in female   . Renal calculi   . Solitary kidney     Patient Active Problem List   Diagnosis Date Noted  . Constipation 08/02/2014  . 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 Condyloma acuminatum 08/06/2009  . SOLITARY KIDNEY 06/08/2009  . TOBACCO USER 01/29/2009  . ANEMIA, MILD 12/18/2008  . DEPRESSION, CHRONIC 11/07/2008  . Overweight(278.02) 03/26/2006    Past Surgical History:  Procedure Laterality Date  . CERVIX LESION DESTRUCTION  2007  . COLECTOMY  2010  . COLON SURGERY  2010   colostomy takedown  . DILATION AND CURETTAGE OF UTERUS     x2  . NEPHRECTOMY      OB History    No data  available       Home Medications    Prior to Admission medications   Medication Sig Start Date End Date Taking? Authorizing Provider  naproxen sodium (ANAPROX) 220 MG tablet Take 220 mg by mouth 2 (two) times daily as needed (for pain).   Yes [provider]  ciprofloxacin (CIPRO) 500 MG tablet Take 1 tablet (500 mg total) by mouth every 12 (twelve) hours. 08/06/16   Ameliyah Sarno, Chase Picket, PA-C  ipratropium (ATROVENT) 0.06 % nasal spray Place 2 sprays into both nostrils 4 (four) times daily. Patient not taking: Reported on 08/06/2016 04/12/15   Linna Hoff, MD  metroNIDAZOLE (FLAGYL) 500 MG tablet Take 1 tablet (500 mg total) by mouth every 8 (eight) hours. 08/06/16   Martesha Niedermeier, Chase Picket, PA-C    Family History Family History  Problem Relation Age of Onset  . Hypertension Mother   . Diabetes Mother   . Diabetes Maternal Aunt   . Heart disease Maternal Aunt   . Hypertension Maternal Aunt   . HIV Father   . Heart disease Maternal Uncle   . Heart disease Maternal Grandmother   . Diabetes Maternal Grandmother     Social History Social History  Substance Use Topics  . Smoking status: Current Every Day Smoker    Packs/day: 0.50    Types: Cigarettes  .  Smokeless tobacco: Never Used  . Alcohol use No     Allergies   Mirtazapine and Morphine and related   Review of Systems Review of Systems  Gastrointestinal: Positive for abdominal pain, diarrhea (x1), nausea and vomiting. Negative for blood in stool and constipation.  All other systems reviewed and are negative.    Physical Exam Updated Vital Signs BP 117/76 (BP Location: Right Arm)   Pulse (!) 52   Temp 98.2 F (36.8 C) (Oral)   Resp 16   LMP 08/04/2016 (Exact Date)   SpO2 98%   Physical Exam  Constitutional: She is oriented to person, place, and time. She appears well-developed and well-nourished. No distress.  HENT:  Head: Normocephalic and atraumatic.  Cardiovascular: Normal rate, regular rhythm and  normal heart sounds.   No murmur heard. Pulmonary/Chest: Effort normal and breath sounds normal. No respiratory distress.  Abdominal: Soft. Bowel sounds are normal. She exhibits no distension.    Tenderness along upper abdomen as depicted in image. No rebound tenderness.  Musculoskeletal: She exhibits no edema.  Neurological: She is alert and oriented to person, place, and time.  Skin: Skin is warm and dry.  Nursing note and vitals reviewed.    ED Treatments / Results  Labs (all labs ordered are listed, but only abnormal results are displayed) Labs Reviewed  COMPREHENSIVE METABOLIC PANEL - Abnormal; Notable for the following:       Result Value   Calcium 8.6 (*)    Albumin 3.3 (*)    AST 14 (*)    ALT 12 (*)    All other components within normal limits  CBC - Abnormal; Notable for the following:    WBC 11.1 (*)    Hemoglobin 11.3 (*)    HCT 34.0 (*)    All other components within normal limits  URINALYSIS, ROUTINE W REFLEX MICROSCOPIC - Abnormal; Notable for the following:    Hgb urine dipstick LARGE (*)    Protein, ur 30 (*)    Bacteria, UA RARE (*)    Squamous Epithelial / LPF 0-5 (*)    All other components within normal limits  LIPASE, BLOOD  I-STAT BETA HCG BLOOD, ED ([facility], [facility], [facility] ONLY)    EKG  EKG Interpretation None       Radiology Ct Abdomen Pelvis W Contrast  Result Date: 08/06/2016 CLINICAL DATA:  Abdominal pain for several months. Nausea, vomiting, and diarrhea for 3 days. EXAM: CT ABDOMEN AND PELVIS WITH CONTRAST TECHNIQUE: Multidetector CT imaging of the abdomen and pelvis was performed using the standard protocol following bolus administration of intravenous contrast. CONTRAST:  100 mL ISOVUE-300 IOPAMIDOL (ISOVUE-300) INJECTION 61% COMPARISON:  03/22/2010 FINDINGS: Lower Chest: No acute findings. Hepatobiliary:  No masses identified. Gallbladder is unremarkable. Pancreas:  No mass or inflammatory changes. Spleen: Within normal limits in size and  appearance. Adrenals/Urinary Tract: Previous left nephrectomy. Persistent 8 mm calculus seen in distal left ureter with stable thickening involving the residual portion of the left mid ureter. Normal appearance of right kidney. No masses identified. No evidence of hydronephrosis. Stomach/Bowel: Stable postop changes from previous left colectomy. Stable small fluid collection with adjacent surgical clip in the left paracolic gutter measuring 3 cm is consistent with a postop lymphocele. Mild to moderate diverticulitis is seen involving the transverse and proximal descending colon. No evidence of abscess or extraluminal air. No evidence of bowel obstruction. Vascular/Lymphatic: No pathologically enlarged lymph nodes. No abdominal aortic aneurysm. Reproductive: Stable small approximately 1.5 cm uterine fibroid. Adnexal regions  are unremarkable. Other:  None. Musculoskeletal:  No suspicious bone lesions identified. IMPRESSION: Mild to moderate diverticulitis involving transverse and proximal descending colon. No evidence of abscess or other complication. Stable postop changes from previous left colectomy, with stable small postop lymphocele in left paracolic gutter. Stable small uterine fibroid. Electronically Signed   By: Myles RosenthalJohn  Stahl M.D.   On: 08/06/2016 10:29    Procedures Procedures (including critical care time)  Medications Ordered in ED Medications  0.9 %  sodium chloride infusion (100 mL/hr Intravenous New Bag/Given 08/06/16 0817)  iopamidol (ISOVUE-300) 61 % injection 100 mL ( Intravenous Canceled Entry 08/06/16 1009)     Initial Impression / Assessment and Plan / ED Course  I have reviewed the triage vital signs and the nursing notes.  Pertinent labs & imaging results that were available during my care of the patient were reviewed by me and considered in my medical decision making (see chart for details).    Helen Stone is a 41 y.o. female who presents to ED for upper abdominal pain x 2-3  days. On exam, patient is afebrile, hemodynamically stable with tenderness across entire upper abdomen. Hx of colectomy and nephrectomy. Will obtain labs and CT abd/pelvis for further evaluation.    Labs reviewed. Stable anemia and mild leukocytosis. CT reviewed showing mild to moderate diverticulitis involving transverse and proximal descending colon. Patient re-evaluated and updated on results. Patient tolerating PO. Pain manageable and does not want pain medication. Spoke at length about reasons to return to ER. Patient agrees to follow up with PCP this week or early next week for recheck. Rx for cipro / flagyl. All questions answered.   Patient discussed with Dr. Madilyn Hookees who agrees with treatment plan.   Final Clinical Impressions(s) / ED Diagnoses   Final diagnoses:  Diverticulitis    New Prescriptions New Prescriptions   CIPROFLOXACIN (CIPRO) 500 MG TABLET    Take 1 tablet (500 mg total) by mouth every 12 (twelve) hours.   METRONIDAZOLE (FLAGYL) 500 MG TABLET    Take 1 tablet (500 mg total) by mouth every 8 (eight) hours.     Rozalia Dino, Chase PicketJaime Pilcher, PA-C 08/06/16 1216    Tilden Fossaees, Elizabeth, MD 08/07/16 1051

## 2016-08-06 NOTE — Discharge Instructions (Signed)
It was my pleasure taking care of you today!   Please take all of your antibiotics until finished!   Do your best to stay on a liquid diet for the next 3 days as we discussed.   You need to call your primary care doctor today to arrange a follow up appointment for recheck at the end of the week or early next week.   Return to ER if you develop a fever, vomiting and cannot get fluids down, abdominal pain suddenly worsens despite taking medication, you develop new / worsening symptoms or if you have any additional concerns.

## 2016-10-02 ENCOUNTER — Ambulatory Visit (INDEPENDENT_AMBULATORY_CARE_PROVIDER_SITE_OTHER): Payer: Medicaid Other | Admitting: Family Medicine

## 2016-10-02 ENCOUNTER — Encounter: Payer: Self-pay | Admitting: Family Medicine

## 2016-10-02 ENCOUNTER — Other Ambulatory Visit (HOSPITAL_COMMUNITY)
Admission: RE | Admit: 2016-10-02 | Discharge: 2016-10-02 | Disposition: A | Discharge status: Home / Self Care | Payer: Medicaid Other | Source: Ambulatory Visit | Attending: Family Medicine | Admitting: Family Medicine

## 2016-10-02 VITALS — BP 118/86 | HR 93 | Temp 98.8°F | Ht 67.0 in | Wt 260.2 lb

## 2016-10-02 DIAGNOSIS — Z113 Encounter for screening for infections with a predominantly sexual mode of transmission: Secondary | ICD-10-CM | POA: Insufficient documentation

## 2016-10-02 DIAGNOSIS — Z124 Encounter for screening for malignant neoplasm of cervix: Secondary | ICD-10-CM | POA: Diagnosis not present

## 2016-10-02 LAB — POCT WET PREP (WET MOUNT)
Clue Cells Wet Prep Whiff POC: NEGATIVE
TRICHOMONAS WET PREP HPF POC: ABSENT

## 2016-10-02 NOTE — Patient Instructions (Addendum)
Thank you for coming in today, it was so nice to see you! Today we talked about:    Screening for STDs: We checked you for all possible STDs today.   We also did a pap smear today  Use condoms to prevent STDs and pregnancy  If you change your mind about birth control, please come and see [place] and we can start you on something   If we ordered any tests today, you will be notified via telephone of any abnormalities. If everything is normal you will get a letter in the mail.   If you have any questions or concerns, please do not hesitate to call the office at [phone]. You can also message me directly via MyChart.   Sincerely,  Anders Simmondshristina Wyatte Dames, MD

## 2016-10-02 NOTE — Progress Notes (Signed)
   Subjective:    Patient ID: Helen Stone , female   DOB: 04/09/1975 , 41 y.o..   MRN: [id number]  HPI  Briar L Sharee PimpleWynn is here for  Chief Complaint  Patient presents with  . STD Check    1. Screening for STDs: Patient is not concerned that she has any STDs she would just like to be tested for them today. She denies any vaginal discharge, painful intercourse, possible STD exposure, dysuria, fever, vaginal bleeding, pelvic pain. She notes that she is intermittently sexually active and has not had sex in the last month. Does not use condoms consistently. Last menstrual period was August 20th. Is not interested in birth control. She is concerned that some of her friends have been spreading rumors about her that she has STDs and she wants proof that she does not have any.  ROS see HPI Smoking Status noted   Past Medical History: Patient Active Problem List   Diagnosis Date Noted  . Screen for STD (sexually transmitted disease) 10/06/2016  . Constipation 08/02/2014  . 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 Condyloma acuminatum 08/06/2009  . SOLITARY KIDNEY 06/08/2009  . TOBACCO USER 01/29/2009  . ANEMIA, MILD 12/18/2008  . DEPRESSION, CHRONIC 11/07/2008  . Overweight(278.02) 03/26/2006    Medications: reviewed and updated  Social Hx:  reports that she has been smoking Cigarettes.  She has been smoking about 0.50 packs per day. She has never used smokeless tobacco.   Objective:   BP 118/86   Pulse 93   Temp 98.8 F (37.1 C) (Oral)   Ht 5\' 7"  (1.702 m)   Wt 260 lb 3.2 oz (118 kg)   LMP 09/23/2016 (Approximate)   SpO2 98%   BMI 40.75 kg/m  Physical Exam  Gen: NAD, alert, cooperative with exam, well-appearing GYN:  External genitalia within normal limits.  Vaginal mucosa pink, moist, normal rugae.  Nonfriable cervix without lesions, no discharge or bleeding noted on speculum exam.  Bimanual exam revealed normal, nongravid uterus.  No cervical motion tenderness. No adnexal masses  bilaterally.   Results for orders placed or performed in visit on 10/02/16  HIV antibody  Result Value Ref Range   HIV Screen 4th Generation wRfx Non Reactive Non Reactive  RPR  Result Value Ref Range   RPR Ser Ql Non Reactive Non Reactive  POCT Wet Prep (Wet [place])  Result Value Ref Range   Source Wet Prep POC VAG    WBC, Wet Prep HPF POC 1-5    Bacteria Wet Prep HPF POC Moderate (A) Few   Clue Cells Wet Prep HPF POC Few (A) None   Clue Cells Wet Prep Whiff POC Negative Whiff    Yeast Wet Prep HPF POC None    Trichomonas Wet Prep HPF POC Absent Absent     Assessment & Plan:  Screen for STD (sexually transmitted disease) No symptoms and gynecologic exam normal. Did full STD work up (wet prep, GC,Chlamydia, RPR, HIV). Wet prep showing few clue cells but asympomatic, will not treat. Follow up as needed. Will call patient with any positive results and send letter if all results are negative.   Anders Simmondshristina Bijan Ridgley, MD [facility], PGY-3

## 2016-10-03 LAB — HIV ANTIBODY (ROUTINE TESTING W REFLEX): HIV SCREEN 4TH GENERATION: NONREACTIVE

## 2016-10-03 LAB — RPR: RPR: NONREACTIVE

## 2016-10-06 DIAGNOSIS — Z113 Encounter for screening for infections with a predominantly sexual mode of transmission: Secondary | ICD-10-CM | POA: Insufficient documentation

## 2016-10-06 LAB — CERVICOVAGINAL ANCILLARY ONLY
Chlamydia: NEGATIVE
Neisseria Gonorrhea: NEGATIVE

## 2016-10-06 LAB — CYTOLOGY - PAP
ADEQUACY: ABSENT
DIAGNOSIS: NEGATIVE
HPV: NOT DETECTED

## 2016-10-06 NOTE — Assessment & Plan Note (Addendum)
No symptoms and gynecologic exam normal. Did full STD work up (wet prep, GC,Chlamydia, RPR, HIV). Pap also performed. Wet prep showing few clue cells but asympomatic, will not treat. Follow up as needed. Will call patient with any positive results and send letter if all results are negative.

## 2016-10-07 ENCOUNTER — Encounter: Payer: Self-pay | Admitting: Family Medicine

## 2017-06-11 ENCOUNTER — Emergency Department (HOSPITAL_COMMUNITY)
Admission: EM | Admit: 2017-06-11 | Discharge: 2017-06-11 | Disposition: A | Discharge status: Home / Self Care | Payer: No Typology Code available for payment source | Attending: Emergency Medicine | Admitting: Emergency Medicine

## 2017-06-11 ENCOUNTER — Emergency Department (HOSPITAL_COMMUNITY): Payer: No Typology Code available for payment source

## 2017-06-11 ENCOUNTER — Encounter (HOSPITAL_COMMUNITY): Payer: Self-pay | Admitting: Nurse Practitioner

## 2017-06-11 ENCOUNTER — Other Ambulatory Visit: Payer: Self-pay

## 2017-06-11 DIAGNOSIS — J45909 Unspecified asthma, uncomplicated: Secondary | ICD-10-CM | POA: Diagnosis not present

## 2017-06-11 DIAGNOSIS — F17210 Nicotine dependence, cigarettes, uncomplicated: Secondary | ICD-10-CM | POA: Insufficient documentation

## 2017-06-11 DIAGNOSIS — R1031 Right lower quadrant pain: Secondary | ICD-10-CM | POA: Diagnosis present

## 2017-06-11 LAB — CBC WITH DIFFERENTIAL/PLATELET
Basophils Absolute: 0 10*3/uL (ref 0.0–0.1)
Basophils Relative: 0 %
Eosinophils Absolute: 0.1 10*3/uL (ref 0.0–0.7)
Eosinophils Relative: 1 %
HEMATOCRIT: 38.5 % (ref 36.0–46.0)
Hemoglobin: 12.7 g/dL (ref 12.0–15.0)
Lymphocytes Relative: 36 %
Lymphs Abs: 2.4 10*3/uL (ref 0.7–4.0)
MCH: 27.9 pg (ref 26.0–34.0)
MCHC: 33 g/dL (ref 30.0–36.0)
MCV: 84.6 fL (ref 78.0–100.0)
Monocytes Absolute: 0.5 10*3/uL (ref 0.1–1.0)
Monocytes Relative: 8 %
NEUTROS ABS: 3.7 10*3/uL (ref 1.7–7.7)
NEUTROS PCT: 55 %
PLATELETS: 194 10*3/uL (ref 150–400)
RBC: 4.55 MIL/uL (ref 3.87–5.11)
RDW: 14.2 % (ref 11.5–15.5)
WBC: 6.7 10*3/uL (ref 4.0–10.5)

## 2017-06-11 LAB — COMPREHENSIVE METABOLIC PANEL
ALBUMIN: 3.7 g/dL (ref 3.5–5.0)
ALT: 13 U/L — ABNORMAL LOW (ref 14–54)
ANION GAP: 9 (ref 5–15)
AST: 16 U/L (ref 15–41)
Alkaline Phosphatase: 52 U/L (ref 38–126)
BILIRUBIN TOTAL: 0.9 mg/dL (ref 0.3–1.2)
BUN: 10 mg/dL (ref 6–20)
CO2: 23 mmol/L (ref 22–32)
Calcium: 9.4 mg/dL (ref 8.9–10.3)
Chloride: 110 mmol/L (ref 101–111)
Creatinine, Ser: 0.8 mg/dL (ref 0.44–1.00)
GFR calc Af Amer: >60 mL/min (ref >60)
GFR calc non Af Amer: >60 mL/min (ref >60)
Glucose, Bld: 92 mg/dL (ref 65–99)
POTASSIUM: 4.1 mmol/L (ref 3.5–5.1)
SODIUM: 142 mmol/L (ref 135–145)
Total Protein: 8.2 g/dL — ABNORMAL HIGH (ref 6.5–8.1)

## 2017-06-11 LAB — URINALYSIS, ROUTINE W REFLEX MICROSCOPIC
Bilirubin Urine: NEGATIVE
GLUCOSE, UA: NEGATIVE mg/dL
HGB URINE DIPSTICK: NEGATIVE
Ketones, ur: NEGATIVE mg/dL
Leukocytes, UA: NEGATIVE
Nitrite: NEGATIVE
PH: 6 (ref 5.0–8.0)
Protein, ur: NEGATIVE mg/dL
SPECIFIC GRAVITY, URINE: 1.018 (ref 1.005–1.030)

## 2017-06-11 LAB — I-STAT BETA HCG BLOOD, ED (MC, WL, AP ONLY): I-stat hCG, quantitative: <5 m[IU]/mL (ref <5)

## 2017-06-11 LAB — LIPASE, BLOOD: Lipase: 27 U/L (ref 11–51)

## 2017-06-11 MED ORDER — HYDROCODONE-ACETAMINOPHEN 5-325 MG PO TABS: 1.0000 | ORAL_TABLET | Freq: Four times a day (QID) | ORAL | 0 refills | Status: DC | PRN

## 2017-06-11 MED ORDER — NAPROXEN 500 MG PO TABS: 500.0000 mg | ORAL_TABLET | Freq: Two times a day (BID) | ORAL | 0 refills | Status: DC

## 2017-06-11 NOTE — Discharge Instructions (Signed)
1.  Your pain appears likely to be due to a strain of the muscles in the abdominal wall.  Take naproxen and Vicodin as needed for pain control.  Return to the emergency department if you develop fever vomiting or worsening symptoms. 2.  Schedule follow-up with your family doctor in the next 3 to 5 days. 3.  There was an incidental ovarian cyst identified on the left.  This is unrelated to your pain.  You should follow-up with your gynecologist for recheck within the next 2 to 4 weeks.
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
## 2017-06-23 ENCOUNTER — Ambulatory Visit (INDEPENDENT_AMBULATORY_CARE_PROVIDER_SITE_OTHER): Payer: BLUE CROSS/BLUE SHIELD | Admitting: Student in an Organized Health Care Education/Training Program

## 2017-06-23 ENCOUNTER — Encounter: Payer: Self-pay | Admitting: Student in an Organized Health Care Education/Training Program

## 2017-06-23 ENCOUNTER — Other Ambulatory Visit: Payer: Self-pay

## 2017-06-23 VITALS — BP 110/70 | HR 78 | Temp 98.5°F | Ht 67.0 in | Wt 242.0 lb

## 2017-06-23 DIAGNOSIS — N83202 Unspecified ovarian cyst, left side: Secondary | ICD-10-CM | POA: Diagnosis not present

## 2017-06-23 DIAGNOSIS — N83209 Unspecified ovarian cyst, unspecified side: Secondary | ICD-10-CM | POA: Insufficient documentation

## 2017-06-23 DIAGNOSIS — Z8659 Personal history of other mental and behavioral disorders: Secondary | ICD-10-CM | POA: Diagnosis not present

## 2017-06-23 NOTE — Progress Notes (Signed)
Subjective:    Helen Stone - 42 y.o. female MRN [id number]  Date of birth: 11/01/1975  HPI  Britta L Clayborn is here for well woman check.  She reports she was previously seen in the emergency department for back pain and was noted to have a incidental ovarian cyst found on renal CT.  The cyst was 4.9 x 3 x 3 cm and was new since the prior CT.  The radiologist recommended follow-up ultrasound to ensure resolution in 4 to 6 weeks.  Patient reports that she is feeling well and has no complaints.  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  Health Maintenance:  Health Maintenance Due  Topic Date Due  . TETANUS/TDAP  11/30/2012    -  reports that she has been smoking cigarettes.  She has been smoking about 0.50 packs per day. She has never used smokeless tobacco. - Review of Systems: Per HPI. - Past Medical History: Patient Active Problem List   Diagnosis Date Noted  . Ovarian cyst 06/23/2017  . 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 Condyloma acuminatum 08/06/2009  . SOLITARY KIDNEY 06/08/2009  . TOBACCO USER 01/29/2009  . DEPRESSION, CHRONIC 11/07/2008  . Overweight(278.02) 03/26/2006   - Medications: reviewed and updated Current Outpatient Medications  Medication Sig Dispense Refill  . hydroxypropyl methylcellulose / hypromellose (ISOPTO TEARS / GONIOVISC) 2.5 % ophthalmic solution Place 1 drop into both eyes daily as needed for dry eyes.     No current facility-administered medications for this visit.     Review of Systems See HPI     Objective:   Physical Exam BP 110/70   Pulse 78   Temp 98.5 F (36.9 C) (Oral)   Ht  (1.702 m)   Wt 242 lb (109.8 kg)   SpO2 98%   BMI 37.90 kg/m  Gen: NAD, alert, cooperative with exam, well-appearing  HEENT: NCAT, PERRL, clear conjunctiva, oropharynx clear, supple neck CV: RRR, good S1/S2, no murmur, no edema, capillary refill brisk  Resp: CTABL, no wheezes,  non-labored Abd: SNTND, BS present, no guarding or organomegaly Skin: no rashes, normal turgor  Neuro: no gross deficits.  Psych: good insight, alert and oriented  Depression screen [facility] 2/9 06/23/2017 10/02/2016 02/21/2015 08/15/2013 07/26/2013  Decreased Interest 0 0 0 0 0  Down, Depressed, Hopeless 1 0 0 0 0  PHQ - 2 Score 1 0 0 0 0  Altered sleeping 2 - - - -  Tired, decreased energy 0 - - - -  Change in appetite 0 - - - -  Feeling bad or failure about yourself  0 - - - -  Trouble concentrating 0 - - - -  Moving slowly or fidgety/restless 0 - - - -  Suicidal thoughts 0 - - - -  PHQ-9 Score 3 - - - -  Difficult doing work/chores Somewhat difficult - - - -      Assessment & Plan:   Ovarian cyst Patient is concerned about this.  3x3x4.9 cm cyst incidentally found on CT abdomen.  Radiologist recommended follow-up ultrasound to ensure resolution at 4 to 6 weeks.  We will schedule an ultrasound in 4 weeks for this to be followed up.  She is not currently having any abdominal pain.  She reports that her menstrual cycle is regular.  Hx of Depression Patient was screened for depression due to history of depression and she did not seem  forthcoming when asked how things were going in the office. PHQ9 was reassuring at 3. She denied suicidality.  Orders Placed This Encounter  Procedures  . US Pelvis Complete    Standing Status:   Future    Standing Expiration Date:   08/24/2018    Order Specific Question:   Reason for Exam (SYMPTOM  OR DIAGNOSIS REQUIRED)    Answer:   eval ovarian cyst    Order Specific Question:   Preferred imaging location?    Answer:   [facility]    No orders of the defined types were placed in this encounter.   Howard Pouch, MD,MS,  PGY2 06/23/2017 4:19 PM

## 2017-06-23 NOTE — Patient Instructions (Addendum)
It was a pleasure seeing you today in our clinic.   Sign up for My Chart to have easy access to your labs results, and communication with your primary care physician.  Our clinic's number is [phone]. Please call with questions or concerns about what we discussed today.  Be well, Dr. Romain Erion    

## 2017-06-23 NOTE — Assessment & Plan Note (Signed)
Patient is concerned about this.  3x3x4.9 cm cyst incidentally found on CT abdomen.  Radiologist recommended follow-up ultrasound to ensure resolution at 4 to 6 weeks.  We will schedule an ultrasound in 4 weeks for this to be followed up.  She is not currently having any abdominal pain.  She reports that her menstrual cycle is regular.

## 2017-07-14 ENCOUNTER — Encounter (HOSPITAL_COMMUNITY): Payer: Self-pay | Admitting: Emergency Medicine

## 2017-07-14 ENCOUNTER — Other Ambulatory Visit: Payer: Self-pay

## 2017-07-14 ENCOUNTER — Ambulatory Visit (HOSPITAL_COMMUNITY)
Admission: EM | Admit: 2017-07-14 | Discharge: 2017-07-14 | Disposition: A | Discharge status: Home / Self Care | Payer: BLUE CROSS/BLUE SHIELD | Attending: Internal Medicine | Admitting: Internal Medicine

## 2017-07-14 DIAGNOSIS — M545 Low back pain, unspecified: Secondary | ICD-10-CM

## 2017-07-14 DIAGNOSIS — T148XXA Other injury of unspecified body region, initial encounter: Secondary | ICD-10-CM

## 2017-07-14 DIAGNOSIS — S39012A Strain of muscle, fascia and tendon of lower back, initial encounter: Secondary | ICD-10-CM

## 2017-07-14 MED ORDER — PREDNISONE 10 MG (21) PO TBPK: ORAL_TABLET | Freq: Every day | ORAL | 0 refills | Status: DC

## 2017-07-14 MED ORDER — CYCLOBENZAPRINE HCL 5 MG PO TABS: 5.0000 mg | ORAL_TABLET | Freq: Every day | ORAL | 0 refills | Status: DC
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
## 2017-07-14 NOTE — Discharge Instructions (Signed)
Start prednisone as directed. Tylenol for pain. Flexeril as needed at night. Flexeril can make you drowsy, so do not take if you are going to drive, operate heavy machinery, or make important decisions. Ice/heat compresses as needed. This can take up to 3-4 weeks to completely resolve, but you should be feeling better each week. Follow up PCP for further evaluation if symptoms persists for reevaluation and referrals as needed. If experience numbness/tingling of the inner thighs, loss of bladder or bowel control, go to the emergency department for evaluation.
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
## 2017-07-24 ENCOUNTER — Ambulatory Visit (HOSPITAL_COMMUNITY)
Admission: RE | Admit: 2017-07-24 | Discharge: 2017-07-24 | Disposition: A | Discharge status: Home / Self Care | Payer: BLUE CROSS/BLUE SHIELD | Source: Ambulatory Visit | Attending: Family Medicine | Admitting: Family Medicine

## 2017-07-24 DIAGNOSIS — N83202 Unspecified ovarian cyst, left side: Secondary | ICD-10-CM | POA: Diagnosis present

## 2017-08-03 ENCOUNTER — Telehealth: Payer: Self-pay

## 2017-08-03 NOTE — Telephone Encounter (Signed)
Pt called nurse line requesting her pcp to call her in regards to her 6/28 ultrasound. Pt states she hasn't heard anything about her results. Please advise.

## 2017-08-04 NOTE — Telephone Encounter (Signed)
Please call and let patient know her ultrasound was normal. The cyst on her left ovary has resolved. No further imaging or follow up is required.

## 2017-08-05 NOTE — Telephone Encounter (Signed)
Informed pt of below. Zimmerman Rumple, Dhiren Azimi D, CMA  

## 2018-05-19 ENCOUNTER — Telehealth: Payer: Self-pay

## 2018-05-19 NOTE — Telephone Encounter (Signed)
Left generic voicemail to call back.   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   Below information from [facility]  Household members and people who have been in close contact with someone who has had symptoms of COVID-19 should stay home as much as possible for 14 days and monitor themselves for symptoms. Close contact means within six feet for at least 10 minutes. If they start having symptoms of COVID-19, they should take the same steps to prevent spreading it.  Coronaviruses like COVID-19 are most often spread through the air by coughing or sneezing, through close personal contact (including touching and shaking hands) or through touching your nose, mouth or eyes before washing your hands. [facility] recommends that persons experiencing fever and cough should stay at home and not go out until their symptoms have completely resolved.  The Centers of Disease Control and Prevention (CDC) has actions you can take to prevent others from getting sick. This includes:   Avoiding contact with others. Covering your mouth and nose when coughing or sneezing. Not traveling or going to school or work while sick. Washing your hands often with soap and water for at least 20 seconds.

## 2018-05-19 NOTE — Telephone Encounter (Signed)
Pt called nurse line stating her daughter tested positive for COVID recently. Patients daughter is also a patient here and an employee with [employer], so her test was performed by health at work. Pts concern is her job is not allowing her back until she test negative for COVID. Informed patient it is not easy to get a COVID test, especially since she is not experiencing symptoms. 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 Im not sure what to do as far as her getting a test to return to work. I asked her to call her HR department to see if a letter for her after the 14 day period is up for her daughter. Please advise.

## 2018-05-20 ENCOUNTER — Encounter: Payer: Self-pay | Admitting: Family Medicine

## 2018-05-20 NOTE — Telephone Encounter (Signed)
In letters tab to return to work on May 4---please let patient know if she develops symptoms (respiratory, fevers) she needs to let us know so that we can addend recommendations.  Helen Starr, MD  Family Medicine Teaching Service

## 2018-05-20 NOTE — Telephone Encounter (Signed)
Patient called back.  Her work is now allowing her to return on May 4 but will need a letter from the provider saying that she is ok to return.  Her daughter has not had a fever in 1.5 weeks and has already returned to work.    Pt has been asymptomatic thus far.  Will forward to MD.   Jone Baseman, CMA

## 2018-05-21 NOTE — Telephone Encounter (Signed)
Left message on VM informing of message below.  Jone Baseman, CMA

## 2018-06-09 ENCOUNTER — Telehealth: Payer: Self-pay | Admitting: Student in an Organized Health Care Education/Training Program

## 2018-06-09 ENCOUNTER — Telehealth (INDEPENDENT_AMBULATORY_CARE_PROVIDER_SITE_OTHER): Payer: Self-pay | Admitting: Family Medicine

## 2018-06-09 ENCOUNTER — Other Ambulatory Visit: Payer: Self-pay

## 2018-06-09 ENCOUNTER — Telehealth: Payer: BLUE CROSS/BLUE SHIELD | Admitting: Family Medicine

## 2018-06-09 ENCOUNTER — Ambulatory Visit: Payer: Self-pay

## 2018-06-09 ENCOUNTER — Encounter: Payer: Self-pay | Admitting: Family Medicine

## 2018-06-09 DIAGNOSIS — Z20822 Contact with and (suspected) exposure to covid-19: Secondary | ICD-10-CM | POA: Insufficient documentation

## 2018-06-09 DIAGNOSIS — R6889 Other general symptoms and signs: Secondary | ICD-10-CM

## 2018-06-09 NOTE — Addendum Note (Signed)
Addended by: Amado Coe on: 06/09/2018 03:15 PM   Modules accepted: Orders

## 2018-06-09 NOTE — Assessment & Plan Note (Addendum)
Patient's daughter tested positive for COVID-19 2 weeks ago.  States that her daughter is now asymptomatic.  However patient works at [employer] and her employer is requiring her to have a note from her doctor stating that she does not have COVID-19.  Does report that yesterday she developed headache, fatigue and sore throat.  She denies any shortness of breath, fever or chills.  Patient states otherwise she feels fine.  She does not currently have a temperature for her home thermometer.   -Ordered outpatient COVID-19 testing. -Based on those results or if patient has worsening/improving symptoms, will provide patient with note on whether or not to return to work.  States that she needs to return to work if it is safe in order to continue making her income.  Patient also advised not to take Aleve for her headaches and fever given that she has history of solitary kidney.  She was advised to try Tylenol instead.  Best phone number to reach the patient by is [phone].

## 2018-06-09 NOTE — Telephone Encounter (Signed)
Incoming call from Patient, inquiring how does she get tested. Related to Patient that she has to have an order from her PCP or or a request from her PCP.  Patient voiced understanding.

## 2018-06-09 NOTE — Progress Notes (Signed)
[place] [facility] Telemedicine Visit  Patient consented to have virtual visit. Method of visit: Telephone  Encounter participants: Patient: Helen Stone - located at home Provider: [place] Lashaya Kienitz - located at [facility] Others (if applicable): n/a  Chief Complaint: needs note to go back to work- fatigue, sore throat, HA  HPI: Patient reports headache, sore throat yesterday with low energy. Her employer said she must have a note to go back to work, work in [employer]. Daughter tested positive for covid-19 two weeks ago, but her daughter is now well. She has not taken her temperature, but feels a little hot. Her temperature by home thermometer is 97.6. Patient denies shortness of breath, cough. Has taken aleve today for HA.   ROS: per HPI  Pertinent PMHx: Solitary kidney, tobacco use disorder, depression  Exam:  Respiratory: speaking comfortably in full sentences  Assessment/Plan:  Suspected Covid-19 Virus Infection Patient's daughter tested positive for COVID-19 2 weeks ago.  States that her daughter is now asymptomatic.  However patient works at [employer] and her employer is requiring her to have a note from her doctor stating that she does not have COVID-19.  Does report that yesterday she developed headache, fatigue and sore throat.  She denies any shortness of breath, fever or chills.  Patient states otherwise she feels fine.  She does not currently have a temperature for her home thermometer.   -Ordered outpatient COVID-19 testing. -Based on those results or if patient has worsening/improving symptoms, will provide patient with note on whether or not to return to work.  States that she needs to return to work if it is safe in order to continue making her income.  Patient also advised not to take Aleve for her headaches and fever given that she has history of solitary kidney.  She was advised to try Tylenol instead.  Best phone number to reach the patient by  is [phone].   Time spent during visit with patient: 9 minutes

## 2018-06-09 NOTE — Progress Notes (Signed)
Strange.  I heard my nurse confirm the appointment.  I then sent doximity text within 5 minutes.  No response.  Nurse and I made several phone calls, all of which went to voice mail.  On the last call, I LM that appointment now canceled and would need to be rescheduled.   This encounter was created in error - please disregard.

## 2018-06-09 NOTE — Telephone Encounter (Signed)
Dr. [place] Shirley from [facility] calling to request COVID testing for the pt. Pt was seen for a virtual visit today and has fatigue, sore throat and headache.  Best contact number for patient is [phone].

## 2018-06-10 ENCOUNTER — Other Ambulatory Visit: Payer: Self-pay

## 2018-06-10 DIAGNOSIS — Z20822 Contact with and (suspected) exposure to covid-19: Secondary | ICD-10-CM

## 2018-06-14 ENCOUNTER — Telehealth: Payer: Self-pay | Admitting: Student in an Organized Health Care Education/Training Program

## 2018-06-14 LAB — NOVEL CORONAVIRUS, NAA: SARS-CoV-2, NAA: NOT DETECTED

## 2018-06-14 NOTE — Telephone Encounter (Signed)
Patient will receive call from [facility] rn regarding results. We do not receive the results at [facility] as discussed during our virtual visit.   Thanks

## 2018-06-14 NOTE — Telephone Encounter (Signed)
Pt is calling to check on the status of her COVID 19 test results. Please call pt back when results are back.

## 2018-06-14 NOTE — Telephone Encounter (Signed)
Routing to doctor who saw pt and ordered this test. April Lamonte Sakai, CMA

## 2018-06-15 ENCOUNTER — Encounter: Payer: Self-pay | Admitting: Student in an Organized Health Care Education/Training Program

## 2018-06-15 NOTE — Telephone Encounter (Signed)
Called and left voicemail with phone number provided by patient during office visit that she stated was the best phone to reach her by.  Informed that her test result was negative.  Will place note in the chart for her to be able to pick up or received via MyChart.  However before patient can pick this note up or have it sent to her, she must report no fever, cough, chills or shortness of breath.  [place] Helen Devino, DO PGY-2, [facility]

## 2018-06-15 NOTE — Telephone Encounter (Signed)
The results are in the lab section for this pt reviewed the process with Shanda Bumps and the paper work we received about the steps to take stated that the Physician or the APP would contact pt with the results. Routing back to Dr. Talbert Forest. April Zimmerman Rumple, CMA

## 2018-06-16 ENCOUNTER — Telehealth: Payer: Self-pay | Admitting: Student in an Organized Health Care Education/Training Program

## 2018-06-16 NOTE — Telephone Encounter (Signed)
Patient needs a return to work letter also.

## 2018-06-16 NOTE — Telephone Encounter (Signed)
LVM to call office to let her know that a letter has been created on her behalf.  She may be able to get it without coming here if she has access to her My Chart or she can have [place] mail it or she can come in and we can print it for her. Akelia Husted Zimmerman Rumple, CMA

## 2018-06-16 NOTE — Telephone Encounter (Signed)
Printed and placed letter up front .April Zimmerman Rumple, CMA

## 2018-06-17 ENCOUNTER — Other Ambulatory Visit: Payer: Self-pay

## 2018-06-17 ENCOUNTER — Other Ambulatory Visit (HOSPITAL_COMMUNITY)
Admission: RE | Admit: 2018-06-17 | Discharge: 2018-06-17 | Disposition: A | Discharge status: Home / Self Care | Payer: Self-pay | Source: Ambulatory Visit | Attending: Family Medicine | Admitting: Family Medicine

## 2018-06-17 ENCOUNTER — Ambulatory Visit (INDEPENDENT_AMBULATORY_CARE_PROVIDER_SITE_OTHER): Payer: Self-pay | Admitting: Family Medicine

## 2018-06-17 VITALS — BP 110/78 | HR 60

## 2018-06-17 DIAGNOSIS — B9689 Other specified bacterial agents as the cause of diseases classified elsewhere: Secondary | ICD-10-CM

## 2018-06-17 DIAGNOSIS — N760 Acute vaginitis: Secondary | ICD-10-CM

## 2018-06-17 DIAGNOSIS — Z113 Encounter for screening for infections with a predominantly sexual mode of transmission: Secondary | ICD-10-CM

## 2018-06-17 DIAGNOSIS — N898 Other specified noninflammatory disorders of vagina: Secondary | ICD-10-CM

## 2018-06-17 LAB — POCT WET PREP (WET MOUNT)
Clue Cells Wet Prep Whiff POC: POSITIVE
Trichomonas Wet Prep HPF POC: ABSENT

## 2018-06-17 MED ORDER — METRONIDAZOLE 500 MG PO TABS: 500.0000 mg | ORAL_TABLET | Freq: Two times a day (BID) | ORAL | 0 refills | Status: DC

## 2018-06-17 NOTE — Progress Notes (Signed)
Acute Office Visit  Subjective:    Patient ID: Helen Stone, female    DOB: [birth year], 43 y.o.   MRN: [id number]  Chief Complaint  Patient presents with  . Vaginal Itching    Patient last had sex in September. Said that she developed "bumps" after that encounter. A few days ago, she developed vaginal itching as well.   Vaginal Itching  The patient's primary symptoms include genital itching and genital lesions. The patient's pertinent negatives include no genital odor, genital rash, missed menses, pelvic pain, vaginal bleeding or vaginal discharge. This is a new problem. The current episode started in the past 7 days. The problem occurs constantly. The problem has been unchanged. The patient is experiencing no pain. Pertinent negatives include no abdominal pain, back pain, chills, dysuria or flank pain. Nothing aggravates the symptoms. She has tried nothing for the symptoms. She is sexually active.     Past Medical History:  Diagnosis Date  . Asthma   . Depression   . Dysplasia of cervix, low grade (CIN 1)    2007 colpo  . Pelvic abscess in female   . Renal calculi   . Solitary kidney     Past Surgical History:  Procedure Laterality Date  . CERVIX LESION DESTRUCTION  2007  . COLECTOMY  2010  . COLON SURGERY  2010   colostomy takedown  . DILATION AND CURETTAGE OF UTERUS     x2  . NEPHRECTOMY      Family History  Problem Relation Age of Onset  . Hypertension Mother   . Diabetes Mother   . Diabetes Maternal Aunt   . Heart disease Maternal Aunt   . Hypertension Maternal Aunt   . HIV Father   . Heart disease Maternal Uncle   . Heart disease Maternal Grandmother   . Diabetes Maternal Grandmother     Social History   Socioeconomic History  . Marital status: Single    Spouse name: Not on file  . Number of children: Not on file  . Years of education: Not on file  . Highest education level: Not on file  Occupational History  . Not on file  Social Needs  .  Financial resource strain: Not on file  . Food insecurity:    Worry: Not on file    Inability: Not on file  . Transportation needs:    Medical: Not on file    Non-medical: Not on file  Tobacco Use  . Smoking status: Current Every Day Smoker    Packs/day: 0.50    Types: Cigarettes  . Smokeless tobacco: Never Used  . Tobacco comment: Working on quitting - 06/23/17  Substance and Sexual Activity  . Alcohol use: No  . Drug use: Yes    Types: Marijuana  . Sexual activity: Yes  Lifestyle  . Physical activity:    Days per week: Not on file    Minutes per session: Not on file  . Stress: Not on file  Relationships  . Social connections:    Talks on phone: Not on file    Gets together: Not on file    Attends religious service: Not on file    Active member of club or organization: Not on file    Attends meetings of clubs or organizations: Not on file    Relationship status: Not on file  . Intimate partner violence:    Fear of current or ex partner: Not on file    Emotionally abused: Not on file  Physically abused: Not on file    Forced sexual activity: Not on file  Other Topics Concern  . Not on file  Social History Narrative  . Not on file    Outpatient Medications Prior to Visit  Medication Sig Dispense Refill  . hydroxypropyl methylcellulose / hypromellose (ISOPTO TEARS / GONIOVISC) 2.5 % ophthalmic solution Place 1 drop into both eyes daily as needed for dry eyes.     No facility-administered medications prior to visit.     Allergies  Allergen Reactions  . Mirtazapine Other (See Comments)    hallucinations and violent dreams  . Morphine And Related Palpitations    Review of Systems  Constitutional: Negative for chills.  Gastrointestinal: Negative for abdominal pain.  Genitourinary: Negative for dysuria, flank pain, missed menses, pelvic pain and vaginal discharge.  Musculoskeletal: Negative for back pain.       Objective:    Physical Exam  Cardiovascular:  Normal rate.  Abdominal: Normal appearance. There is no abdominal tenderness. There is no CVA tenderness.  Genitourinary:    Vagina normal.  There is no rash, tenderness, lesion or injury on the right labia. There is no rash, tenderness, lesion or injury on the left labia. Cervix exhibits no motion tenderness and no discharge.    No vaginal discharge, erythema, tenderness or bleeding.  No erythema, tenderness or bleeding in the vagina.    No foreign body in the vagina.     No signs of injury in the vagina.     Genitourinary Comments: Excessive vaginal wall tissue making difficult to fully visualize the cervix.    Neurological: She is alert. She exhibits normal muscle tone.    BP 110/78   Pulse 60   LMP 05/07/2018 (Approximate)  Wt Readings from Last 3 Encounters:  06/23/17 242 lb (109.8 kg)  06/11/17 249 lb (112.9 kg)  10/02/16 260 lb 3.2 oz (118 kg)    Health Maintenance Due  Topic Date Due  . TETANUS/TDAP  11/30/2012    There are no preventive care reminders to display for this patient.   Lab Results  Component Value Date   TSH 0.958 03/22/2010   Lab Results  Component Value Date   WBC 6.7 06/11/2017   HGB 12.7 06/11/2017   HCT 38.5 06/11/2017   MCV 84.6 06/11/2017   PLT 194 06/11/2017   Lab Results  Component Value Date   NA 142 06/11/2017   K 4.1 06/11/2017   CO2 23 06/11/2017   GLUCOSE 92 06/11/2017   BUN 10 06/11/2017   CREATININE 0.80 06/11/2017   BILITOT 0.9 06/11/2017   ALKPHOS 52 06/11/2017   AST 16 06/11/2017   ALT 13 (L) 06/11/2017   PROT 8.2 (H) 06/11/2017   ALBUMIN 3.7 06/11/2017   CALCIUM 9.4 06/11/2017   ANIONGAP 9 06/11/2017   Lab Results  Component Value Date   CHOL 153 08/24/2012   Lab Results  Component Value Date   HDL 45 08/24/2012   Lab Results  Component Value Date   LDLCALC 96 08/24/2012   Lab Results  Component Value Date   TRIG 61 08/24/2012   Lab Results  Component Value Date   CHOLHDL 3.4 08/24/2012   Lab  Results  Component Value Date   HGBA1C 5.4 05/28/2011       Assessment & Plan:   Patient presents with vaginal itching. BV on wet prep. With history of lesions (although not observed on exam), will pursue HSV blood testing in addition to HIV, RPR, GC/Chlamydia.  -  Flagyl 500 mg BID for 7 days - f/u lab results - return if symptoms not improved.   Problem List Items Addressed This Visit    None    Visit Diagnoses    Screen for STD (sexually transmitted disease)    -  Primary   Relevant Orders   HIV antibody (with reflex)   RPR   Cervicovaginal ancillary only   POCT Wet Prep [facility]) (Completed)   HSV(herpes smplx)abs-1+2(IgG+IgM)-bld   Vaginal irritation       Relevant Orders   HIV antibody (with reflex)   RPR   Cervicovaginal ancillary only   POCT Wet Prep [facility]) (Completed)   Bacterial vaginosis       Relevant Medications   metroNIDAZOLE (FLAGYL) 500 MG tablet       Meds ordered this encounter  Medications  . metroNIDAZOLE (FLAGYL) 500 MG tablet    Sig: Take 1 tablet (500 mg total) by mouth 2 (two) times daily.    Dispense:  7 tablet    Refill:  0     Garnette Gunner, MD

## 2018-06-17 NOTE — Patient Instructions (Signed)
Vaginitis    Vaginitis is irritation and swelling (inflammation) of the vagina. It happens when normal bacteria and yeast in the vagina grow too much. There are many types of this condition. Treatment will depend on the type you have.  Follow these instructions at home:  Lifestyle  · Keep your vagina area clean and dry.  ? Avoid using soap.  ? Rinse the area with water.  · Do not do the following until your doctor says it is okay:  ? Wash and clean out the vagina (douche).  ? Use tampons.  ? Have sex.  · Wipe from front to back after going to the bathroom.  · Let air reach your vagina.  ? Wear cotton underwear.  ? Do not wear:  ? Underwear while you sleep.  ? Tight pants.  ? Thong underwear.  ? Underwear or nylons without a cotton panel.  ? Take off any wet clothing, such as bathing suits, as soon as possible.  · Use gentle, non-scented products. Do not use things that can irritate the vagina, such as fabric softeners. Avoid the following products if they are scented:  ? Feminine sprays.  ? Detergents.  ? Tampons.  ? Feminine hygiene products.  ? Soaps or bubble baths.  · Practice safe sex and use condoms.  General instructions  · Take over-the-counter and prescription medicines only as told by your doctor.  · If you were prescribed an antibiotic medicine, take or use it as told by your doctor. Do not stop taking or using the antibiotic even if you start to feel better.  · Keep all follow-up visits as told by your doctor. This is important.  Contact a doctor if:  · You have pain in your belly.  · You have a fever.  · Your symptoms last for more than 2-3 days.  Get help right away if:  · You have a fever and your symptoms get worse all of a sudden.  Summary  · Vaginitis is irritation and swelling of the vagina. It can happen when the normal bacteria and yeast in the vagina grow too much. There are many types.  · Treatment will depend on the type you have.  · Do not douche, use tampons , or have sex until your health  care provider approves. When you can return to sex, practice safe sex and use condoms.  This information is not intended to replace advice given to you by your health care provider. Make sure you discuss any questions you have with your health care provider.  Document Released: 04/11/2008 Document Revised: 02/05/2016 Document Reviewed: 02/05/2016  Elsevier Interactive Patient Education © 2019 [employer] Inc[employer]

## 2018-06-18 ENCOUNTER — Encounter: Payer: Self-pay | Admitting: Family Medicine

## 2018-06-18 DIAGNOSIS — B009 Herpesviral infection, unspecified: Secondary | ICD-10-CM

## 2018-06-18 LAB — CERVICOVAGINAL ANCILLARY ONLY
Chlamydia: NEGATIVE
Neisseria Gonorrhea: NEGATIVE

## 2018-06-18 MED ORDER — ACYCLOVIR 200 MG PO CAPS: 200.0000 mg | ORAL_CAPSULE | Freq: Every day | ORAL | 0 refills | Status: AC

## 2018-06-18 NOTE — Progress Notes (Signed)
Called patient. No answer. LVM to call back. Did not discuss results as they are sensitive.

## 2018-06-19 LAB — HSV(HERPES SMPLX)ABS-I+II(IGG+IGM)-BLD
HSV 1 Glycoprotein G Ab, IgG: 39.5 index — ABNORMAL HIGH (ref 0.00–0.90)
HSV 2 IgG, Type Spec: 3.64 index — ABNORMAL HIGH (ref 0.00–0.90)
HSVI/II Comb IgM: 1.35 Ratio — ABNORMAL HIGH (ref 0.00–0.90)

## 2018-06-19 LAB — RPR: RPR Ser Ql: NONREACTIVE

## 2018-06-19 LAB — HIV ANTIBODY (ROUTINE TESTING W REFLEX): HIV Screen 4th Generation wRfx: NONREACTIVE

## 2018-06-19 LAB — HSV-2 IGG SUPPLEMENTAL TEST: HSV-2 IgG Supplemental Test: POSITIVE — AB

## 2018-10-18 ENCOUNTER — Other Ambulatory Visit: Payer: Self-pay | Admitting: Family Medicine

## 2018-10-18 ENCOUNTER — Ambulatory Visit: Payer: Self-pay | Admitting: Family Medicine

## 2018-10-18 DIAGNOSIS — Z1231 Encounter for screening mammogram for malignant neoplasm of breast: Secondary | ICD-10-CM

## 2019-03-17 ENCOUNTER — Encounter: Payer: Self-pay | Admitting: Family Medicine

## 2020-02-13 ENCOUNTER — Encounter: Payer: Self-pay | Admitting: Family Medicine

## 2020-06-20 ENCOUNTER — Ambulatory Visit (INDEPENDENT_AMBULATORY_CARE_PROVIDER_SITE_OTHER): Payer: Medicaid Other | Admitting: Family Medicine

## 2020-06-20 ENCOUNTER — Other Ambulatory Visit: Payer: Self-pay

## 2020-06-20 VITALS — BP 124/90 | HR 87 | Ht 67.0 in | Wt 243.5 lb

## 2020-06-20 DIAGNOSIS — L0103 Bullous impetigo: Secondary | ICD-10-CM

## 2020-06-20 DIAGNOSIS — Z124 Encounter for screening for malignant neoplasm of cervix: Secondary | ICD-10-CM

## 2020-06-20 DIAGNOSIS — A6000 Herpesviral infection of urogenital system, unspecified: Secondary | ICD-10-CM

## 2020-06-20 MED ORDER — VALACYCLOVIR HCL 500 MG PO TABS: 500.0000 mg | ORAL_TABLET | Freq: Two times a day (BID) | ORAL | 12 refills | Status: DC

## 2020-06-20 NOTE — Assessment & Plan Note (Signed)
Patient with left anterior/medial thigh lesion concerning for bullous impetigo.  Concern for contact dermatitis to topical iodine/rubbing alcohol and explained surrounding itch. -Recommend keeping covered and applying bacitracin/Neosporin to the wound twice daily until it is gone -Strict return precautions provided should it change/develop or worsen.

## 2020-06-20 NOTE — Patient Instructions (Signed)
Thank you for coming in to see [place] today! Please see below to review our plan for today's visit:  1. Apply Bacitracin/Neosporin to the left thigh wound twice daily until gone. If it worsens come back and see [place]. Try not to scratch it. Neosporin/Triple Antibiotic can be purchased over the counter .  2. You can try to apply Iodine to another spot to see if it gets worse.  3. If you develop worsening rash, fevers, change in color of the rash (to yellow/green color), have surrounding redness, body aches, chills, etc, please either come back to see [place] or seek medical care elsewhere.   Please call the clinic at [phone] if your symptoms worsen or you have any concerns. It was our pleasure to serve you!   Dr. [facility]

## 2020-06-20 NOTE — Assessment & Plan Note (Signed)
Patient with previous diagnosis from May 2020 of HSV.  Patient states she did not know about this diagnosis. -Prescription for Valtrex 500 mg twice daily sent to patient's pharmacy

## 2020-06-20 NOTE — Assessment & Plan Note (Signed)
-  Pap smear performed at today's visit, will follow-up with results and treat as needed

## 2020-06-20 NOTE — Progress Notes (Signed)
    SUBJECTIVE:   CHIEF COMPLAINT / HPI:   Bump on Left leg: 1 week ago after washing her dog she noticed a small "water bump" on her left anterior thigh. Saturday she put some iodine on it and it formed little bumps around it, bumps around it were really small, they were the color of her skin. A scab came off of it yesterday 5/24, some clear "stuff" came out of it.   Vaginal itch: for a few months, says it's on the outside, says she has some bumps on the outside of the vaginal area. She had itching and tried OTC Yeast infection medication from [facility]. She's not sure if it's herpes. The bumps popped up 1-2 months ago and are located around the pubic hair and are itchy. The bumps weren't there until recently. She has been applying gold bond medicated powder to the area. Otherwise denies vaginal odor, vaginal pain, vaginal discharge, abnormal uterine bleeding. Denies burning, just itches.  Previous diagnosis of herpes: On reviewing patient's chart it was noted that she had a previous diagnosis of herpes.  Previous provider appears to send the patient to letters (unclear if via mail or via MyChart), I seem to call her but was unable to reach her so left a voicemail asking her to call back.  Patient was unaware of this diagnosis and was very upset regarding this diagnosis.  Previous provider also attempted to send the patient acyclovir, however patient reports she never received this medication.  Attending refilled Valtrex for the patient today.  Health maintenance: Patient is due for Pap smear  PERTINENT  PMH / PSH:  Patient Active Problem List   Diagnosis Date Noted  . Bullous impetigo 06/20/2020  . Encounter for Papanicolaou smear for cervical cancer screening 06/20/2020  . Genital herpes simplex 06/20/2020  . Suspected COVID-19 virus infection 06/09/2018  . Ovarian cyst 06/23/2017  . 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 Condyloma acuminatum 08/06/2009  . SOLITARY KIDNEY 06/08/2009  . TOBACCO USER  01/29/2009  . DEPRESSION, CHRONIC 11/07/2008  . Overweight(278.02) 03/26/2006    OBJECTIVE:   BP 124/90   Pulse 87   Ht 5\' 7"  (1.702 m)   Wt 243 lb 8 oz (110.5 kg)   LMP 06/16/2020   SpO2 98%   BMI 38.14 kg/m    Physical exam: General: well appearing, no acute distress Respiratory: Comfortable work of breathing on room air Integumentary: See photo below, total area measures about 4 cm GU: Normal-appearing labia minora and majora without any lesion, normal-appearing vaginal rugated with scant clear/white vaginal discharge, normal-appearing cervix with closed cervical os without lesion, discharge or bleeding     ASSESSMENT/PLAN:   Bullous impetigo Patient with left anterior/medial thigh lesion concerning for bullous impetigo.  Concern for contact dermatitis to topical iodine/rubbing alcohol and explained surrounding itch. -Recommend keeping covered and applying bacitracin/Neosporin to the wound twice daily until it is gone -Strict return precautions provided should it change/develop or worsen.  Encounter for Papanicolaou smear for cervical cancer screening -Pap smear performed at today's visit, will follow-up with results and treat as needed  Genital herpes simplex Patient with previous diagnosis from May 2020 of HSV.  Patient states she did not know about this diagnosis. -Prescription for Valtrex 500 mg twice daily sent to patient's pharmacy     June 2020, DO [facility]

## 2020-06-21 NOTE — Addendum Note (Signed)
Addended by: Dollene Cleveland on: 06/21/2020 12:20 PM   Modules accepted: Orders

## 2020-06-27 LAB — CYTOLOGY - PAP
Chlamydia: NEGATIVE
Comment: NEGATIVE
Comment: NEGATIVE
Comment: NORMAL
Diagnosis: UNDETERMINED — AB
High risk HPV: NEGATIVE
Neisseria Gonorrhea: NEGATIVE

## 2020-06-28 ENCOUNTER — Encounter: Payer: Self-pay | Admitting: Family Medicine

## 2020-07-04 ENCOUNTER — Ambulatory Visit: Payer: Medicaid Other | Admitting: Family Medicine

## 2021-07-02 ENCOUNTER — Encounter: Payer: Self-pay | Admitting: *Deleted

## 2021-07-20 ENCOUNTER — Other Ambulatory Visit: Payer: Self-pay | Admitting: Family Medicine

## 2022-01-11 ENCOUNTER — Other Ambulatory Visit: Payer: Self-pay | Admitting: Family Medicine
